# Patient Record
Sex: Female | Born: 1990 | Hispanic: Yes | Marital: Single | Smoking: Former smoker
Health system: Southern US, Community
[De-identification: ages and names within clinical notes are randomized; demographics above are authoritative.]

## PROBLEM LIST (undated history)

## (undated) ENCOUNTER — Inpatient Hospital Stay (HOSPITAL_COMMUNITY): Payer: Self-pay

## (undated) DIAGNOSIS — F32A Depression, unspecified: Secondary | ICD-10-CM

## (undated) DIAGNOSIS — F329 Major depressive disorder, single episode, unspecified: Secondary | ICD-10-CM

## (undated) HISTORY — PX: TUBAL LIGATION: SHX77

---

## 2008-06-28 ENCOUNTER — Emergency Department (HOSPITAL_COMMUNITY): Admission: EM | Admit: 2008-06-28 | Discharge: 2008-06-28 | Payer: Self-pay | Admitting: Emergency Medicine

## 2008-06-29 ENCOUNTER — Inpatient Hospital Stay (HOSPITAL_COMMUNITY): Admission: AD | Admit: 2008-06-29 | Discharge: 2008-07-05 | Payer: Self-pay | Admitting: Obstetrics

## 2008-07-06 ENCOUNTER — Inpatient Hospital Stay (HOSPITAL_COMMUNITY): Admission: AD | Admit: 2008-07-06 | Discharge: 2008-07-06 | Payer: Self-pay | Admitting: Obstetrics

## 2008-08-05 ENCOUNTER — Inpatient Hospital Stay (HOSPITAL_COMMUNITY): Admission: AD | Admit: 2008-08-05 | Discharge: 2008-08-05 | Payer: Self-pay | Admitting: Obstetrics

## 2008-08-06 ENCOUNTER — Inpatient Hospital Stay (HOSPITAL_COMMUNITY): Admission: AD | Admit: 2008-08-06 | Discharge: 2008-08-06 | Payer: Self-pay | Admitting: Obstetrics

## 2008-08-09 ENCOUNTER — Inpatient Hospital Stay (HOSPITAL_COMMUNITY): Admission: AD | Admit: 2008-08-09 | Discharge: 2008-08-24 | Payer: Self-pay | Admitting: Obstetrics

## 2008-08-09 ENCOUNTER — Encounter: Payer: Self-pay | Admitting: Obstetrics

## 2008-08-21 ENCOUNTER — Encounter (INDEPENDENT_AMBULATORY_CARE_PROVIDER_SITE_OTHER): Payer: Self-pay | Admitting: Obstetrics

## 2008-08-26 ENCOUNTER — Inpatient Hospital Stay (HOSPITAL_COMMUNITY): Admission: AD | Admit: 2008-08-26 | Discharge: 2008-08-26 | Payer: Self-pay | Admitting: Obstetrics

## 2008-09-03 ENCOUNTER — Inpatient Hospital Stay (HOSPITAL_COMMUNITY): Admission: AD | Admit: 2008-09-03 | Discharge: 2008-09-03 | Payer: Self-pay | Admitting: Obstetrics

## 2010-08-22 ENCOUNTER — Other Ambulatory Visit: Payer: Self-pay | Admitting: Obstetrics & Gynecology

## 2010-08-22 ENCOUNTER — Inpatient Hospital Stay (HOSPITAL_COMMUNITY): Payer: Self-pay

## 2010-08-22 ENCOUNTER — Inpatient Hospital Stay (HOSPITAL_COMMUNITY)
Admission: AD | Admit: 2010-08-22 | Discharge: 2010-08-22 | Disposition: A | Discharge status: Home / Self Care | Payer: Self-pay | Source: Ambulatory Visit | Attending: Obstetrics & Gynecology | Admitting: Obstetrics & Gynecology

## 2010-08-22 ENCOUNTER — Inpatient Hospital Stay (HOSPITAL_COMMUNITY)
Admission: AD | Admit: 2010-08-22 | Discharge: 2010-08-22 | Disposition: A | Discharge status: Home / Self Care | Payer: Self-pay | Source: Ambulatory Visit | Attending: Obstetrics and Gynecology | Admitting: Obstetrics and Gynecology

## 2010-08-22 DIAGNOSIS — O200 Threatened abortion: Secondary | ICD-10-CM | POA: Insufficient documentation

## 2010-08-22 LAB — ABO/RH: ABO/RH(D): O POS

## 2010-08-22 LAB — WET PREP, GENITAL
Trich, Wet Prep: NONE SEEN
Yeast Wet Prep HPF POC: NONE SEEN

## 2010-08-22 LAB — URINE MICROSCOPIC-ADD ON

## 2010-08-22 LAB — URINALYSIS, ROUTINE W REFLEX MICROSCOPIC
Bilirubin Urine: NEGATIVE
Glucose, UA: NEGATIVE mg/dL
Ketones, ur: >80 mg/dL — AB
pH: 6 (ref 5.0–8.0)

## 2010-08-22 LAB — CBC
Hemoglobin: 12 g/dL (ref 12.0–15.0)
RBC: 3.76 MIL/uL — ABNORMAL LOW (ref 3.87–5.11)

## 2010-08-24 ENCOUNTER — Inpatient Hospital Stay (HOSPITAL_COMMUNITY)
Admission: AD | Admit: 2010-08-24 | Discharge: 2010-08-24 | Disposition: A | Discharge status: Home / Self Care | Payer: Self-pay | Source: Ambulatory Visit | Attending: Obstetrics & Gynecology | Admitting: Obstetrics & Gynecology

## 2010-08-24 DIAGNOSIS — O035 Genital tract and pelvic infection following complete or unspecified spontaneous abortion: Secondary | ICD-10-CM

## 2010-08-24 DIAGNOSIS — O039 Complete or unspecified spontaneous abortion without complication: Secondary | ICD-10-CM | POA: Insufficient documentation

## 2010-08-24 LAB — HCG, QUANTITATIVE, PREGNANCY: hCG, Beta Chain, Quant, S: 25 m[IU]/mL — ABNORMAL HIGH (ref <5)

## 2010-08-30 LAB — CBC
HCT: 29.4 % — ABNORMAL LOW (ref 36.0–46.0)
HCT: 30.5 % — ABNORMAL LOW (ref 36.0–46.0)
Hemoglobin: 10.2 g/dL — ABNORMAL LOW (ref 12.0–15.0)
Hemoglobin: 10.6 g/dL — ABNORMAL LOW (ref 12.0–15.0)
Hemoglobin: 8.8 g/dL — ABNORMAL LOW (ref 12.0–15.0)
MCHC: 34.6 g/dL (ref 30.0–36.0)
MCHC: 34.6 g/dL (ref 30.0–36.0)
MCHC: 35.3 g/dL (ref 30.0–36.0)
MCV: 97 fL (ref 78.0–100.0)
MCV: 97.9 fL (ref 78.0–100.0)
RBC: 3.01 MIL/uL — ABNORMAL LOW (ref 3.87–5.11)
RBC: 3.12 MIL/uL — ABNORMAL LOW (ref 3.87–5.11)
RDW: 13.4 % (ref 11.5–15.5)
RDW: 13.6 % (ref 11.5–15.5)
RDW: 13.7 % (ref 11.5–15.5)

## 2010-08-30 LAB — URINALYSIS, ROUTINE W REFLEX MICROSCOPIC
Ketones, ur: 15 mg/dL — AB
Protein, ur: NEGATIVE mg/dL
Urobilinogen, UA: 0.2 mg/dL (ref 0.0–1.0)

## 2010-08-30 LAB — URINE MICROSCOPIC-ADD ON

## 2010-08-31 ENCOUNTER — Inpatient Hospital Stay (HOSPITAL_COMMUNITY)
Admission: AD | Admit: 2010-08-31 | Discharge: 2010-08-31 | Disposition: A | Discharge status: Home / Self Care | Payer: Self-pay | Source: Ambulatory Visit | Attending: Obstetrics & Gynecology | Admitting: Obstetrics & Gynecology

## 2010-08-31 DIAGNOSIS — O039 Complete or unspecified spontaneous abortion without complication: Secondary | ICD-10-CM | POA: Insufficient documentation

## 2010-08-31 LAB — GLUCOSE TOLERANCE, 1 HOUR: Glucose, 1 Hour GTT: 92 mg/dL (ref 70–140)

## 2010-08-31 LAB — CBC
MCHC: 34.3 g/dL (ref 30.0–36.0)
MCV: 99.2 fL (ref 78.0–100.0)
Platelets: 223 10*3/uL (ref 150–400)
RBC: 3.37 MIL/uL — ABNORMAL LOW (ref 3.87–5.11)
RDW: 13.9 % (ref 11.5–15.5)

## 2010-08-31 LAB — RAPID HIV SCREEN (WH-MAU): Rapid HIV Screen: NONREACTIVE

## 2010-09-05 LAB — CBC
HCT: 33 % — ABNORMAL LOW (ref 36.0–46.0)
HCT: 33.9 % — ABNORMAL LOW (ref 36.0–46.0)
Hemoglobin: 11.3 g/dL — ABNORMAL LOW (ref 12.0–15.0)
Hemoglobin: 12.1 g/dL (ref 12.0–15.0)
Hemoglobin: 11.1 g/dL — ABNORMAL LOW (ref 12.0–15.0)
MCHC: 35.3 g/dL (ref 30.0–36.0)
MCV: 95.8 fL (ref 78.0–100.0)
Platelets: 216 10*3/uL (ref 150–400)
RBC: 3.29 MIL/uL — ABNORMAL LOW (ref 3.87–5.11)
RBC: 3.54 MIL/uL — ABNORMAL LOW (ref 3.87–5.11)
RBC: 3.34 MIL/uL — ABNORMAL LOW (ref 3.87–5.11)
RDW: 14 % (ref 11.5–15.5)
RDW: 14.2 % (ref 11.5–15.5)
WBC: 12.5 10*3/uL — ABNORMAL HIGH (ref 4.0–10.5)
WBC: 13.4 10*3/uL — ABNORMAL HIGH (ref 4.0–10.5)
WBC: 8.4 10*3/uL (ref 4.0–10.5)

## 2010-09-05 LAB — BASIC METABOLIC PANEL
BUN: 4 mg/dL — ABNORMAL LOW (ref 6–23)
Chloride: 102 mEq/L (ref 96–112)
GFR calc Af Amer: >60 mL/min (ref >60)
GFR calc non Af Amer: >60 mL/min (ref >60)
Potassium: 3.5 mEq/L (ref 3.5–5.1)
Sodium: 133 mEq/L — ABNORMAL LOW (ref 135–145)

## 2010-09-05 LAB — DIFFERENTIAL
Lymphs Abs: 1.7 10*3/uL (ref 0.7–4.0)
Monocytes Absolute: 0.5 10*3/uL (ref 0.1–1.0)
Monocytes Relative: 6 % (ref 3–12)
Neutro Abs: 6 10*3/uL (ref 1.7–7.7)
Neutrophils Relative %: 72 % (ref 43–77)

## 2010-09-05 LAB — WET PREP, GENITAL
Clue Cells Wet Prep HPF POC: NONE SEEN
Clue Cells Wet Prep HPF POC: NONE SEEN
Trich, Wet Prep: NONE SEEN

## 2010-09-13 ENCOUNTER — Encounter: Payer: Self-pay | Admitting: Obstetrics and Gynecology

## 2010-10-03 NOTE — Op Note (Signed)
Anita [name], THURGOOD              ACCOUNT NO.:  [id number]   MEDICAL RECORD NO.:  [id number]          PATIENT TYPE:  INP   LOCATION:  9149                          [facility]ILITY:  [facility]   PHYSICIAN:  Kathreen Cosier, M.D.DATE OF BIRTH:  15-Dec-1990   DATE OF PROCEDURE:  08/22/2008  DATE OF DISCHARGE:                               OPERATIVE REPORT   PREOPERATIVE DIAGNOSES:  Prolonged ruptured membranes since August 09, 2008, breech presentation, and now active labor and bleeding.   PROCEDURE:  Classical cesarean section.   SURGEON:  Kathreen Cosier, MD   ANESTHESIA:  Spinal.   PROCEDURE:  The patient was placed on the operating table in supine  position.  After spinal administered, abdomen prepped and draped.  Bladder emptied with a Foley catheter.  Transverse suprapubic incision  was made, carried down to the rectus fascia.  Fascia cleaned and incised  length of the incision.  Recti muscles retracted laterally.  Peritoneum  was incised longitudinally, the lower uterine segment was not developed  and it was decided do a classical incision on the uterus.  This was done  and the patient was delivered of a frank breech female, Apgars 5 and 7,  weighing 1190 g.  The team was in attendance.  The placenta was  anterior, low lying, removed manually, and the uterine cavity cleaned  with dry laps.  The uterine incision was closed in 2 layers with  continuous suture of #1 chromic.  Hemostasis was satisfactory.  Blood  loss was 500 mL.  The lap and sponge counts were correct.  The abdomen  was closed in layers.  The peritoneum was closed with continuous suture  of 0 chromic.  The fascia was closed with 0 Dexon continuous.  Skin  closed with subcuticular stitch of 4-0 Monocryl.  The patient tolerated  the procedure well, taken to recovery room in good condition.            ______________________________  Kathreen Cosier, M.D.     BAM/MEDQ  D:  08/22/2008  T:  08/22/2008  Job:   [id number]

## 2010-10-03 NOTE — Discharge Summary (Signed)
NAMECATARINA, Anita [name]              ACCOUNT NO.:  [id number]   MEDICAL RECORD NO.:  [id number]          PATIENT TYPE:  INP   LOCATION:  9319                          [facility]   PHYSICIAN:  Kathreen Cosier, M.D.DATE OF BIRTH:  [birth year]   DATE OF ADMISSION:  08/09/2008  DATE OF DISCHARGE:                               DISCHARGE SUMMARY   The patient is a 20 year old gravida 1, EDC November 25, 2008.  She had  premature ruptured membranes at 19 weeks and had been on bed rest at  home.  No fever, no contractions with minimal leakage, and she was  readmitted at 24 weeks and ultrasound showed a breech presentation, AFI  1 cm.  She got betamethasone x2 and the estimated weight by ultrasound  at that point was greater than 800 g.  The patient got Delta-Lutin 250  mg IM weekly and she was also on Procardia.  On August 21, 2008,  ultrasound showed still a breech, 2 pounds 6 ounces, and the patient  started contracting and having some bleeding and underwent classical C-  section on August 22, 2008, to a low-lying anterior placenta.  Apgars were  5 and 7, baby weighed 1190 g.  Placenta was sent to Pathology.  Postoperatively, she did well.  Her hemoglobin was 8.8.  She was  discharged on the third postoperative day, ambulatory on a regular diet  to see me in 6 weeks.   DISCHARGE DIAGNOSES:  Status post prolonged ruptured membranes from 19  weeks, classical C-section, to see me in 6 weeks.           ______________________________  Kathreen Cosier, M.D.     BAM/MEDQ  D:  08/24/2008  T:  08/24/2008  Job:  [id number]

## 2010-10-03 NOTE — Discharge Summary (Signed)
[name], [name]              ACCOUNT NO.:  [id number]   MEDICAL RECORD NO.:  [id number]          PATIENT TYPE:  INP   LOCATION:  9151                          [facility]   PHYSICIAN:  Kathreen Cosier, M.D.DATE OF BIRTH:  December 16, 1990   DATE OF ADMISSION:  06/29/2008  DATE OF DISCHARGE:  07/05/2008                               DISCHARGE SUMMARY   The patient is a 20 year old gravida 1, EDC November 25, 2008, and she had  gone to [facility] on February 8 with left lower quadrant pain and  sent home on bed rest, and she was seen on February 9.  Speculum exam by  the PA was Nitrazine positive.  Sonogram showed normal flow.  Cervix was  long, closed, and she was admitted on bed rest and no leakage was noted.  Repeat speculum exam on February 10, Nitrazine negative.  Cervix long,  closed.  She was on ampicillin 2 g IV every 6 hours and erythromycin 500  IV and 250 every 6 hours.  She had had some irritability and was placed  on magnesium.  On July 01, 2008, she saturated a pad and had  occasional contractions from the night of speculum exam.  On the morning  of February 11, showed fluid coming from the cervix, Nitrazine positive  when she was 19 weeks and 4 days, and the patient elected not to  terminate the pregnancy.  Her leakage was very intermittent and she went  48 hours without leakage.  She was taught how to use a thermometer, she  is on bed rest at home.  She was also instructed to come to the  hospital, if her temperature is 100.4 or greater than that.  She was  seen in consult with MFM who concurred a decision to send her home on  bedrest since she had stopped leaking.  She was also placed on Procardia  XL 30 b.i.d.   DISCHARGE DIAGNOSIS:  Status post premature ruptured membranes at 19  weeks and 4 days.           ______________________________  Kathreen Cosier, M.D.     BAM/MEDQ  D:  07/28/2008  T:  07/28/2008  Job:  [id number]

## 2010-11-23 ENCOUNTER — Other Ambulatory Visit: Payer: Self-pay | Admitting: Obstetrics and Gynecology

## 2010-11-23 ENCOUNTER — Encounter (INDEPENDENT_AMBULATORY_CARE_PROVIDER_SITE_OTHER): Payer: Self-pay | Admitting: Obstetrics and Gynecology

## 2010-11-23 DIAGNOSIS — Z124 Encounter for screening for malignant neoplasm of cervix: Secondary | ICD-10-CM

## 2010-11-23 DIAGNOSIS — Z01419 Encounter for gynecological examination (general) (routine) without abnormal findings: Secondary | ICD-10-CM

## 2010-11-24 NOTE — Group Therapy Note (Signed)
NAME:  Anita [name], Anita [name]              ACCOUNT NO.:  [id number]  MEDICAL RECORD NO.:  [id number]           PATIENT TYPE:  A  LOCATION:  [facility]                   FACILITY:  [facility]  PHYSICIAN:  Argentina Donovan, MD        DATE OF BIRTH:  May 19, 1991  DATE OF SERVICE:  11/23/2010                                 CLINIC NOTE  The patient is a 20 year old English-speaking [place] female, who had a cesarean section at 24 weeks with a pregnancy 2 years ago after rupturing membranes at 19 weeks and being in the hospital for several weeks.  She said that she had uterine infection and that is why they did the C-section.  The baby did not survive.  Then, this past April, she was a couple of weeks pregnant and her beta-hCG just about a 100, went into the MAU.  She was bleeding and 2 days later, was down to two.  She had one period, is waiting to have another.  They are trying to conceive.  She is currently on prenatal vitamins.  She has not had a Pap smear in a year and her only complaint is that she has full urge to urinate following sex.  Her urinalysis and pelvic wet prep was negative in the MAU, so I am giving her a prescription of Macrobid to take one before or after sex each time to see if that does not get rid of that condition for her.  On examination, external genitalia is normal.  BUS within normal limits.  Vagina is clean and well rugated.  The cervix is clean, little areas of inflammation.  Pap smear was taken.  Uterus was anterior, normal in size, shape, consistency and the adnexa is normal.  IMPRESSION:  Normal gynecological examination, pending Pap smear result, history of preterm labor, gravida 2, para 0-1-1-0.          ______________________________ Argentina Donovan, MD    PR/MEDQ  D:  11/23/2010  T:  11/24/2010  Job:  [id number]

## 2011-07-02 ENCOUNTER — Other Ambulatory Visit: Payer: Self-pay | Admitting: Obstetrics

## 2011-07-02 NOTE — H&P (Signed)
Anita [name], Anita [name]              ACCOUNT NO.:  [id number]  MEDICAL RECORD NO.:  [id number]  LOCATION:  PERIO                         [facility]  PHYSICIAN:  Kathreen Cosier, M.D.DATE OF BIRTH:  03-21-1991  DATE OF ADMISSION:  07/03/2011 DATE OF DISCHARGE:                             HISTORY & PHYSICAL   HISTORY OF PRESENT ILLNESS:  The patient is a 21 year old, gravida 3, para 0-1-1-0 who has had 26 week loss.  She had a classical C-section. 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 By ultrasound, she is due December 31, 2011, making her 13 weeks and 4 days, and  she is now in for cervical cerclage.  PAST MEDICAL HISTORY:  The patient had a history of postpartum depression.  PAST SURGICAL HISTORY:  Classical C-section.  SOCIAL HISTORY:  Negative.  FAMILY HISTORY:  Negative.  SYSTEM REVIEW:  Negative.  PHYSICAL EXAMINATION:  GENERAL:  Well-developed female in no distress. HEENT:  Negative. LUNGS:  Clear. HEART:  Regular rhythm.  No murmurs, no gallops. BREASTS:  No masses. ABDOMEN:  Negative.  Uterus 14 weeks size. PELVIC:  As described above. EXTREMITIES:  Negative.          ______________________________ Kathreen Cosier, M.D.     BAM/MEDQ  D:  07/02/2011  T:  07/02/2011  Job:  [id number]

## 2011-07-03 ENCOUNTER — Encounter (HOSPITAL_COMMUNITY): Payer: Self-pay | Admitting: Pharmacy Technician

## 2011-07-03 ENCOUNTER — Ambulatory Visit (HOSPITAL_COMMUNITY)
Admission: RE | Admit: 2011-07-03 | Discharge: 2011-07-03 | Disposition: A | Discharge status: Home / Self Care | Payer: Self-pay | Source: Ambulatory Visit | Attending: Obstetrics | Admitting: Obstetrics

## 2011-07-03 ENCOUNTER — Encounter (HOSPITAL_COMMUNITY)
Admission: RE | Disposition: A | Discharge status: Home / Self Care | Payer: Self-pay | Source: Ambulatory Visit | Attending: Obstetrics

## 2011-07-03 ENCOUNTER — Ambulatory Visit (HOSPITAL_COMMUNITY): Payer: Self-pay | Admitting: Anesthesiology

## 2011-07-03 ENCOUNTER — Encounter (HOSPITAL_COMMUNITY): Payer: Self-pay | Admitting: *Deleted

## 2011-07-03 ENCOUNTER — Encounter (HOSPITAL_COMMUNITY): Payer: Self-pay | Admitting: Anesthesiology

## 2011-07-03 DIAGNOSIS — O3430 Maternal care for cervical incompetence, unspecified trimester: Secondary | ICD-10-CM | POA: Insufficient documentation

## 2011-07-03 DIAGNOSIS — Z98891 History of uterine scar from previous surgery: Secondary | ICD-10-CM

## 2011-07-03 HISTORY — PX: CERVICAL CERCLAGE: SHX1329

## 2011-07-03 HISTORY — DX: Major depressive disorder, single episode, unspecified: F32.9

## 2011-07-03 HISTORY — DX: Depression, unspecified: F32.A

## 2011-07-03 LAB — CBC
HCT: 34.5 % — ABNORMAL LOW (ref 36.0–46.0)
Hemoglobin: 12 g/dL (ref 12.0–15.0)
MCH: 32.3 pg (ref 26.0–34.0)
MCV: 92.7 fL (ref 78.0–100.0)
RBC: 3.72 MIL/uL — ABNORMAL LOW (ref 3.87–5.11)

## 2011-07-03 SURGERY — CERCLAGE, CERVIX, VAGINAL APPROACH
Anesthesia: Spinal | Site: Cervix | Wound class: Clean Contaminated

## 2011-07-03 MED ORDER — FENTANYL CITRATE 0.05 MG/ML IJ SOLN: 25.0000 ug | INTRAMUSCULAR | Status: DC | PRN

## 2011-07-03 MED ORDER — FENTANYL CITRATE 0.05 MG/ML IJ SOLN
INTRAMUSCULAR | Status: AC
  Filled 2011-07-03: qty 2

## 2011-07-03 MED ORDER — FENTANYL CITRATE 0.05 MG/ML IJ SOLN
INTRAMUSCULAR | Status: DC | PRN
  Administered 2011-07-03: 50 ug via INTRAVENOUS

## 2011-07-03 MED ORDER — EPHEDRINE 5 MG/ML INJ
INTRAVENOUS | Status: AC
  Filled 2011-07-03: qty 10

## 2011-07-03 MED ORDER — LACTATED RINGERS IV SOLN
INTRAVENOUS | Status: DC
  Administered 2011-07-03 (×2): via INTRAVENOUS

## 2011-07-03 MED ORDER — BUPIVACAINE IN DEXTROSE 0.75-8.25 % IT SOLN
INTRATHECAL | Status: DC | PRN
  Administered 2011-07-03: 1 mL via INTRATHECAL

## 2011-07-03 MED ORDER — ONDANSETRON HCL 4 MG/2ML IJ SOLN
INTRAMUSCULAR | Status: AC
  Filled 2011-07-03: qty 2

## 2011-07-03 MED ORDER — EPHEDRINE SULFATE 50 MG/ML IJ SOLN
INTRAMUSCULAR | Status: DC | PRN
  Administered 2011-07-03 (×3): 10 mg via INTRAVENOUS

## 2011-07-03 MED ORDER — ONDANSETRON HCL 4 MG/2ML IJ SOLN
INTRAMUSCULAR | Status: DC | PRN
  Administered 2011-07-03: 4 mg via INTRAVENOUS

## 2011-07-03 SURGICAL SUPPLY — 15 items
CATH ROBINSON RED A/P 16FR (CATHETERS) ×2 IMPLANT
CLOTH BEACON ORANGE TIMEOUT ST (SAFETY) ×2 IMPLANT
COUNTER NEEDLE 1200 MAGNETIC (NEEDLE) ×2 IMPLANT
GLOVE BIO SURGEON STRL SZ8.5 (GLOVE) ×4 IMPLANT
GOWN PREVENTION PLUS XXLARGE (GOWN DISPOSABLE) ×2 IMPLANT
GOWN STRL REIN XL XLG (GOWN DISPOSABLE) ×4 IMPLANT
NEEDLE MA TROC 1/2 (NEEDLE) IMPLANT
NEEDLE MAYO .5 CIRCLE (NEEDLE) ×2 IMPLANT
PACK VAGINAL MINOR WOMEN LF (CUSTOM PROCEDURE TRAY) ×2 IMPLANT
PAD PREP 24X48 CUFFED NSTRL (MISCELLANEOUS) ×2 IMPLANT
SUT MERSILENE 5MM BP 1 12 (SUTURE) ×2 IMPLANT
TOWEL OR 17X24 6PK STRL BLUE (TOWEL DISPOSABLE) ×4 IMPLANT
TUBING NON-CON 1/4 X 20 CONN (TUBING) IMPLANT
WATER STERILE IRR 1000ML POUR (IV SOLUTION) ×2 IMPLANT
YANKAUER SUCT BULB TIP NO VENT (SUCTIONS) ×2 IMPLANT

## 2011-07-03 NOTE — H&P (Signed)
  There has been no change in the patient's history and her physical exam today is unchanged and

## 2011-07-03 NOTE — Transfer of Care (Signed)
Immediate Anesthesia Transfer of Care Note  Patient: Anita [name]  Procedure(s) Performed: Procedure(s) (LRB): CERCLAGE CERVICAL (N/A)  Patient Location: PACU  Anesthesia Type: Spinal  Level of Consciousness: awake, alert  and oriented  Airway & Oxygen Therapy: Patient Spontanous Breathing  Post-op Assessment: Report given to PACU RN and Post -op Vital signs reviewed and stable  Post vital signs: Reviewed and stable  Complications: No apparent anesthesia complications

## 2011-07-03 NOTE — Op Note (Signed)
preop diagnosis incompetent cervix Postop diagnosis the same Procedure cervical cerclage Anesthesia spinal Surgeon Dr. Francoise Ceo Procedure after the spinal administered patient in the lithotomy position perineum and vagina prepped and draped data entered with a straight catheter weighted speculum placed the vagina and the cervix was grasped at 12:00 with a sponge forcep using a #5 Mersilene band cervix was entered at 12:00 exited at 9:00 and then a you because of Mersilene brought up to 6:00 this is in and out of side in the usual manner and the is Mersilene tied at 6:00 patient tolerated the procedure well taken to recovery room in good condition end of dictation dictated by Dr. Gaynell Face to follow 13 thank you

## 2011-07-03 NOTE — Discharge Instructions (Signed)
Discharge instructions   You can wash your hair  Shower  Eat what you want  Drink what you want  See me in 6 weeks  Your ankles are going to swell more in the next 2 weeks than when pregnant  No sex for 6 weeks   Kiylah Loyer A, MD 07/03/2011

## 2011-07-03 NOTE — Anesthesia Procedure Notes (Signed)
Spinal  Patient location during procedure: OR Start time: 07/03/2011 7:12 AM End time: 07/03/2011 7:14 AM Staffing Anesthesiologist: Sandrea Hughs Performed by: anesthesiologist  Preanesthetic Checklist Completed: patient identified, site marked, surgical consent, pre-op evaluation, timeout performed, IV checked, risks and benefits discussed and monitors and equipment checked Spinal Block Patient position: sitting Prep: DuraPrep Patient monitoring: heart rate, cardiac monitor, continuous pulse ox and blood pressure Approach: midline Location: L3-4 Injection technique: single-shot Needle Needle type: Sprotte  Needle gauge: 24 G Needle length: 9 cm Needle insertion depth: 4 cm Assessment Sensory level: T12

## 2011-07-03 NOTE — Anesthesia Preprocedure Evaluation (Signed)
Anesthesia Evaluation  Patient identified by MRN, date of birth, ID band Patient awake    Reviewed: Allergy & Precautions, H&P , NPO status , Patient's Chart, lab work & pertinent test results, reviewed documented beta blocker date and time   History of Anesthesia Complications Negative for: history of anesthetic complications  Airway Mallampati: I TM Distance: >3 FB Neck ROM: full    Dental  (+) Teeth Intact   Pulmonary neg pulmonary ROS,  clear to auscultation        Cardiovascular neg cardio ROS regular Normal    Neuro/Psych PSYCHIATRIC DISORDERS (no meds) Negative Neurological ROS     GI/Hepatic negative GI ROS, Neg liver ROS,   Endo/Other  Negative Endocrine ROS  Renal/GU negative Renal ROS  Genitourinary negative   Musculoskeletal   Abdominal   Peds  Hematology negative hematology ROS (+)   Anesthesia Other Findings   Reproductive/Obstetrics (+) Pregnancy (incompetent cervix)                           Anesthesia Physical Anesthesia Plan  ASA: II  Anesthesia Plan: Spinal   Post-op Pain Management:    Induction:   Airway Management Planned:   Additional Equipment:   Intra-op Plan:   Post-operative Plan:   Informed Consent: I have reviewed the patients History and Physical, chart, labs and discussed the procedure including the risks, benefits and alternatives for the proposed anesthesia with the patient or authorized representative who has indicated his/her understanding and acceptance.   Dental Advisory Given  Plan Discussed with: Surgeon and CRNA  Anesthesia Plan Comments:         Anesthesia Quick Evaluation

## 2011-07-03 NOTE — Anesthesia Postprocedure Evaluation (Signed)
Anesthesia Post Note  Patient: Anita [name]  Procedure(s) Performed: Procedure(s) (LRB): CERCLAGE CERVICAL (N/A)  Anesthesia type: Spinal  Patient location: PACU  Post pain: Pain level controlled  Post assessment: Post-op Vital signs reviewed  Last Vitals:  Filed Vitals:   07/03/11 0610  BP: 96/53  Pulse:   Temp:   Resp:     Post vital signs: Reviewed  Level of consciousness: awake  Complications: No apparent anesthesia complications

## 2011-07-04 ENCOUNTER — Encounter (HOSPITAL_COMMUNITY): Payer: Self-pay | Admitting: Obstetrics

## 2011-08-15 LAB — OB RESULTS CONSOLE ABO/RH: RH Type: POSITIVE

## 2011-08-15 LAB — OB RESULTS CONSOLE HEPATITIS B SURFACE ANTIGEN: Hepatitis B Surface Ag: NEGATIVE

## 2011-08-15 LAB — OB RESULTS CONSOLE GC/CHLAMYDIA: Chlamydia: NEGATIVE

## 2011-11-20 ENCOUNTER — Other Ambulatory Visit: Payer: Self-pay | Admitting: Obstetrics

## 2011-11-21 NOTE — H&P (Signed)
NAME:  Anita [name], Anita [name]      ACCOUNT NO.:  [id number]  MEDICAL RECORD NO.:  [id number]  LOCATION:                                 FACILITY:  PHYSICIAN:  Kathreen Cosier, M.D.   DATE OF BIRTH:  DATE OF ADMISSION: DATE OF DISCHARGE:                             HISTORY & PHYSICAL   The patient is a 21 year old, gravida 3, para 0-1-1-0 whose EDC is December 31, 2011.  She is [redacted] weeks pregnant.  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  She had a classical incision done on her uterus, and at the recommendation of maternal fetal medicine, she is now in for a repeat C-section at 36 weeks.  The patient also had a history of incompetent cervix, and she had a cerclage placed on July 03, 2011.  She has been kept on progesterone suppositories throughout the pregnancy and Procardia 60 XL and has done well up to this point, so she is in now at 36 weeks for repeat C-section.  PAST MEDICAL HISTORY:  As described above.  PAST SURGICAL HISTORY:  As described above.  SYSTEM REVIEW:  Negative.  PHYSICAL EXAM:  GENERAL: Revealed a well-developed female, in no distress. HEENT:  Negative. BREASTS: Negative. LUNGS: Clear. HEART: Regular rhythm.  No murmurs, no gallops. ABDOMEN: Term. CERVIX:  Cerclage. EXTREMITIES: Negative.          ______________________________ Kathreen Cosier, M.D.     BAM/MEDQ  D:  11/20/2011  T:  11/21/2011  Job:  [id number]

## 2011-11-24 ENCOUNTER — Encounter (HOSPITAL_COMMUNITY): Payer: Self-pay | Admitting: Pharmacist

## 2011-11-27 ENCOUNTER — Encounter (HOSPITAL_COMMUNITY): Payer: Self-pay

## 2011-11-28 ENCOUNTER — Encounter (HOSPITAL_COMMUNITY)
Admission: RE | Admit: 2011-11-28 | Discharge: 2011-11-28 | Disposition: A | Discharge status: Home / Self Care | Payer: Medicaid Other | Source: Ambulatory Visit | Attending: Obstetrics | Admitting: Obstetrics

## 2011-11-28 ENCOUNTER — Encounter (HOSPITAL_COMMUNITY): Payer: Self-pay

## 2011-11-28 LAB — TYPE AND SCREEN

## 2011-11-28 LAB — SURGICAL PCR SCREEN: MRSA, PCR: NEGATIVE

## 2011-11-28 LAB — CBC
HCT: 34.6 % — ABNORMAL LOW (ref 36.0–46.0)
Hemoglobin: 11.8 g/dL — ABNORMAL LOW (ref 12.0–15.0)
MCH: 31.9 pg (ref 26.0–34.0)
MCHC: 34.1 g/dL (ref 30.0–36.0)

## 2011-11-28 LAB — RAPID HIV SCREEN (WH-MAU): Rapid HIV Screen: NONREACTIVE

## 2011-11-28 NOTE — Patient Instructions (Addendum)
  Your procedure is scheduled on: Tuesday, July 16  Enter through the [employer] of [facility] at: 10:00am Pick up the phone at the desk and dial [phone] and inform us of your arrival.  Please call this number if you have any problems the morning of surgery: [phone]  Remember: Do not eat food after midnight: Monday Do not drink clear liquids after: Monday  Do not wear jewelry, make-up, or FINGER nail polish No metal in your hair or on your body. Do not wear lotions, powders, perfumes or deodorant. Do not shave 48 hours prior to surgery. Do not bring valuables to the hospital. Contacts, dentures or bridgework may not be worn into surgery.  Leave suitcase in the car. After Surgery it may be brought to your room. For patients being admitted to the hospital, checkout time is 11:00am the day of discharge.  Remember to use your hibiclens as instructed.Please shower with 1/2 bottle the evening before your surgery and the other 1/2 bottle the morning of surgery. Neck down avoiding private area.

## 2011-12-03 NOTE — H&P (Signed)
NAME:  Anita [name], Anita [name]      ACCOUNT NO.:  [id number]  MEDICAL RECORD NO.:  [id number]  LOCATION:                                 FACILITY:  PHYSICIAN:  Kathreen Cosier, M.D.DATE OF BIRTH:  1991/03/02  DATE OF ADMISSION: DATE OF DISCHARGE:                             HISTORY & PHYSICAL   The patient is a 21 year old, gravida 3, para 0-1-1-0 whose EDC is December 31, 2011.  She is [redacted] weeks pregnant.  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  She had a classical incision done on her uterus at that time, and the recommendation of MFM is that she should now repeat section at 36 weeks without amniocentesis.  She also had a history of an incompetent cervix and she had a cerclage placed on July 03, 2011.  She has been on progesterone suppositories throughout the pregnancy on Procardia 60 XL once daily and has done well up to this point.  She is now at 36 weeks for repeat C-section.  PAST MEDICAL HISTORY:  As described above.  PAST SURGICAL HISTORY:  As described above.  SYSTEM REVIEW:  Negative.  PHYSICAL EXAMINATION:  GENERAL:  A well-developed female, in no distress. HEENT:  Negative. BREASTS:  Negative. LUNGS:  Clear. HEART:  Regular rhythm.  No murmurs, no gallops. ABDOMEN:  36 weeks size. GU:  Cervix closed with a cerclage. EXTREMITIES:  Negative.          ______________________________ Kathreen Cosier, M.D.     BAM/MEDQ  D:  12/03/2011  T:  12/03/2011  Job:  [id number]

## 2011-12-04 ENCOUNTER — Inpatient Hospital Stay (HOSPITAL_COMMUNITY)
Admission: RE | Admit: 2011-12-04 | Discharge: 2011-12-07 | DRG: 765 | Disposition: A | Discharge status: Home / Self Care | Payer: Medicaid Other | Source: Ambulatory Visit | Attending: Obstetrics | Admitting: Obstetrics

## 2011-12-04 ENCOUNTER — Inpatient Hospital Stay (HOSPITAL_COMMUNITY): Payer: Medicaid Other

## 2011-12-04 ENCOUNTER — Encounter (HOSPITAL_COMMUNITY): Payer: Self-pay | Admitting: *Deleted

## 2011-12-04 ENCOUNTER — Encounter (HOSPITAL_COMMUNITY): Payer: Self-pay

## 2011-12-04 ENCOUNTER — Encounter (HOSPITAL_COMMUNITY): Payer: Self-pay | Admitting: Anesthesiology

## 2011-12-04 ENCOUNTER — Encounter (HOSPITAL_COMMUNITY)
Admission: RE | Disposition: A | Discharge status: Home / Self Care | Payer: Self-pay | Source: Ambulatory Visit | Attending: Obstetrics

## 2011-12-04 DIAGNOSIS — R51 Headache: Secondary | ICD-10-CM | POA: Diagnosis not present

## 2011-12-04 DIAGNOSIS — O99893 Other specified diseases and conditions complicating puerperium: Secondary | ICD-10-CM | POA: Diagnosis not present

## 2011-12-04 DIAGNOSIS — O34219 Maternal care for unspecified type scar from previous cesarean delivery: Principal | ICD-10-CM | POA: Diagnosis present

## 2011-12-04 DIAGNOSIS — Z98891 History of uterine scar from previous surgery: Secondary | ICD-10-CM

## 2011-12-04 DIAGNOSIS — O3430 Maternal care for cervical incompetence, unspecified trimester: Secondary | ICD-10-CM | POA: Diagnosis present

## 2011-12-04 DIAGNOSIS — H538 Other visual disturbances: Secondary | ICD-10-CM | POA: Diagnosis not present

## 2011-12-04 LAB — TYPE AND SCREEN: ABO/RH(D): O POS

## 2011-12-04 SURGERY — Surgical Case
Anesthesia: Spinal | Site: Abdomen | Wound class: Clean Contaminated

## 2011-12-04 MED ORDER — OXYTOCIN 10 UNIT/ML IJ SOLN
INTRAMUSCULAR | Status: AC
  Filled 2011-12-04: qty 4

## 2011-12-04 MED ORDER — SIMETHICONE 80 MG PO CHEW: 80.0000 mg | CHEWABLE_TABLET | ORAL | Status: DC | PRN

## 2011-12-04 MED ORDER — BUPIVACAINE IN DEXTROSE 0.75-8.25 % IT SOLN
INTRATHECAL | Status: DC | PRN
  Administered 2011-12-04: 1.5 mL via INTRATHECAL

## 2011-12-04 MED ORDER — DIPHENHYDRAMINE HCL 50 MG/ML IJ SOLN: 25.0000 mg | INTRAMUSCULAR | Status: DC | PRN

## 2011-12-04 MED ORDER — NALBUPHINE SYRINGE 5 MG/0.5 ML
INJECTION | INTRAMUSCULAR | Status: AC
  Administered 2011-12-04: 10 mg via SUBCUTANEOUS
  Filled 2011-12-04: qty 1

## 2011-12-04 MED ORDER — DIPHENHYDRAMINE HCL 25 MG PO CAPS: 25.0000 mg | ORAL_CAPSULE | Freq: Four times a day (QID) | ORAL | Status: DC | PRN

## 2011-12-04 MED ORDER — SODIUM CHLORIDE 0.9 % IJ SOLN: 3.0000 mL | INTRAMUSCULAR | Status: DC | PRN

## 2011-12-04 MED ORDER — CEFAZOLIN SODIUM-DEXTROSE 2-3 GM-% IV SOLR
2.0000 g | Freq: Once | INTRAVENOUS | Status: AC
  Administered 2011-12-04: 2 g via INTRAVENOUS

## 2011-12-04 MED ORDER — ONDANSETRON HCL 4 MG/2ML IJ SOLN: 4.0000 mg | Freq: Three times a day (TID) | INTRAMUSCULAR | Status: DC | PRN

## 2011-12-04 MED ORDER — LACTATED RINGERS IV SOLN
INTRAVENOUS | Status: DC
  Administered 2011-12-04: 21:00:00 via INTRAVENOUS

## 2011-12-04 MED ORDER — NALBUPHINE SYRINGE 5 MG/0.5 ML
5.0000 mg | INJECTION | INTRAMUSCULAR | Status: DC | PRN
  Administered 2011-12-04 – 2011-12-05 (×2): 10 mg via SUBCUTANEOUS
  Filled 2011-12-04 (×2): qty 1

## 2011-12-04 MED ORDER — SCOPOLAMINE 1 MG/3DAYS TD PT72: 1.0000 | MEDICATED_PATCH | Freq: Once | TRANSDERMAL | Status: DC

## 2011-12-04 MED ORDER — ZOLPIDEM TARTRATE 5 MG PO TABS: 5.0000 mg | ORAL_TABLET | Freq: Every evening | ORAL | Status: DC | PRN

## 2011-12-04 MED ORDER — FENTANYL CITRATE 0.05 MG/ML IJ SOLN
INTRAMUSCULAR | Status: AC
  Filled 2011-12-04: qty 2

## 2011-12-04 MED ORDER — OXYCODONE-ACETAMINOPHEN 5-325 MG PO TABS
1.0000 | ORAL_TABLET | ORAL | Status: DC | PRN
  Administered 2011-12-05 – 2011-12-07 (×6): 1 via ORAL
  Filled 2011-12-04 (×6): qty 1

## 2011-12-04 MED ORDER — OXYTOCIN 40 UNITS IN LACTATED RINGERS INFUSION - SIMPLE MED: 62.5000 mL/h | INTRAVENOUS | Status: AC

## 2011-12-04 MED ORDER — NALOXONE HCL 0.4 MG/ML IJ SOLN: 0.4000 mg | INTRAMUSCULAR | Status: DC | PRN

## 2011-12-04 MED ORDER — NALBUPHINE SYRINGE 5 MG/0.5 ML
5.0000 mg | INJECTION | INTRAMUSCULAR | Status: DC | PRN
  Administered 2011-12-05: 5 mg via INTRAVENOUS
  Filled 2011-12-04 (×2): qty 1

## 2011-12-04 MED ORDER — WITCH HAZEL-GLYCERIN EX PADS: 1.0000 "application " | MEDICATED_PAD | CUTANEOUS | Status: DC | PRN

## 2011-12-04 MED ORDER — DIBUCAINE 1 % RE OINT: 1.0000 "application " | TOPICAL_OINTMENT | RECTAL | Status: DC | PRN

## 2011-12-04 MED ORDER — LACTATED RINGERS IV SOLN
INTRAVENOUS | Status: DC | PRN
  Administered 2011-12-04: 12:00:00 via INTRAVENOUS

## 2011-12-04 MED ORDER — SCOPOLAMINE 1 MG/3DAYS TD PT72
MEDICATED_PATCH | TRANSDERMAL | Status: AC
  Administered 2011-12-04: 1.5 mg via TRANSDERMAL
  Filled 2011-12-04: qty 1

## 2011-12-04 MED ORDER — OXYTOCIN 10 UNIT/ML IJ SOLN
INTRAMUSCULAR | Status: DC | PRN
  Administered 2011-12-04: 40 [IU] via INTRAMUSCULAR

## 2011-12-04 MED ORDER — KETOROLAC TROMETHAMINE 30 MG/ML IJ SOLN: 30.0000 mg | Freq: Four times a day (QID) | INTRAMUSCULAR | Status: AC | PRN

## 2011-12-04 MED ORDER — ONDANSETRON HCL 4 MG/2ML IJ SOLN: 4.0000 mg | INTRAMUSCULAR | Status: DC | PRN

## 2011-12-04 MED ORDER — SODIUM CHLORIDE 0.9 % IV SOLN
1.0000 ug/kg/h | INTRAVENOUS | Status: DC | PRN
  Filled 2011-12-04: qty 2.5

## 2011-12-04 MED ORDER — MEPERIDINE HCL 25 MG/ML IJ SOLN: 6.2500 mg | INTRAMUSCULAR | Status: DC | PRN

## 2011-12-04 MED ORDER — IBUPROFEN 600 MG PO TABS
600.0000 mg | ORAL_TABLET | Freq: Four times a day (QID) | ORAL | Status: DC | PRN
  Administered 2011-12-06: 600 mg via ORAL
  Filled 2011-12-04 (×9): qty 1

## 2011-12-04 MED ORDER — DIPHENHYDRAMINE HCL 25 MG PO CAPS
25.0000 mg | ORAL_CAPSULE | ORAL | Status: DC | PRN
  Filled 2011-12-04: qty 1

## 2011-12-04 MED ORDER — MORPHINE SULFATE (PF) 0.5 MG/ML IJ SOLN
INTRAMUSCULAR | Status: DC | PRN
  Administered 2011-12-04: 100 ug via INTRATHECAL

## 2011-12-04 MED ORDER — KETOROLAC TROMETHAMINE 60 MG/2ML IM SOLN
60.0000 mg | Freq: Once | INTRAMUSCULAR | Status: AC | PRN
  Administered 2011-12-04: 60 mg via INTRAMUSCULAR

## 2011-12-04 MED ORDER — LANOLIN HYDROUS EX OINT: 1.0000 "application " | TOPICAL_OINTMENT | CUTANEOUS | Status: DC | PRN

## 2011-12-04 MED ORDER — TETANUS-DIPHTH-ACELL PERTUSSIS 5-2.5-18.5 LF-MCG/0.5 IM SUSP
0.5000 mL | Freq: Once | INTRAMUSCULAR | Status: AC
  Administered 2011-12-05: 0.5 mL via INTRAMUSCULAR
  Filled 2011-12-04: qty 0.5

## 2011-12-04 MED ORDER — ONDANSETRON HCL 4 MG PO TABS: 4.0000 mg | ORAL_TABLET | ORAL | Status: DC | PRN

## 2011-12-04 MED ORDER — SENNOSIDES-DOCUSATE SODIUM 8.6-50 MG PO TABS
2.0000 | ORAL_TABLET | Freq: Every day | ORAL | Status: DC
  Administered 2011-12-04 – 2011-12-06 (×3): 2 via ORAL

## 2011-12-04 MED ORDER — PRENATAL MULTIVITAMIN CH
1.0000 | ORAL_TABLET | Freq: Every day | ORAL | Status: DC
  Administered 2011-12-05 – 2011-12-07 (×2): 1 via ORAL
  Filled 2011-12-04 (×2): qty 1

## 2011-12-04 MED ORDER — SCOPOLAMINE 1 MG/3DAYS TD PT72
1.0000 | MEDICATED_PATCH | Freq: Once | TRANSDERMAL | Status: DC
  Administered 2011-12-04: 1.5 mg via TRANSDERMAL

## 2011-12-04 MED ORDER — MENTHOL 3 MG MT LOZG: 1.0000 | LOZENGE | OROMUCOSAL | Status: DC | PRN

## 2011-12-04 MED ORDER — 0.9 % SODIUM CHLORIDE (POUR BTL) OPTIME
TOPICAL | Status: DC | PRN
  Administered 2011-12-04: 1000 mL

## 2011-12-04 MED ORDER — IBUPROFEN 600 MG PO TABS
600.0000 mg | ORAL_TABLET | Freq: Four times a day (QID) | ORAL | Status: DC
  Administered 2011-12-05 – 2011-12-07 (×9): 600 mg via ORAL
  Filled 2011-12-04 (×3): qty 1

## 2011-12-04 MED ORDER — FENTANYL CITRATE 0.05 MG/ML IJ SOLN: 25.0000 ug | INTRAMUSCULAR | Status: DC | PRN

## 2011-12-04 MED ORDER — SIMETHICONE 80 MG PO CHEW
80.0000 mg | CHEWABLE_TABLET | Freq: Three times a day (TID) | ORAL | Status: DC
  Administered 2011-12-04 – 2011-12-07 (×8): 80 mg via ORAL

## 2011-12-04 MED ORDER — KETOROLAC TROMETHAMINE 60 MG/2ML IM SOLN
INTRAMUSCULAR | Status: AC
  Administered 2011-12-04: 60 mg via INTRAMUSCULAR
  Filled 2011-12-04: qty 2

## 2011-12-04 MED ORDER — FENTANYL CITRATE 0.05 MG/ML IJ SOLN
INTRAMUSCULAR | Status: DC | PRN
  Administered 2011-12-04: 15 ug via INTRATHECAL

## 2011-12-04 MED ORDER — CEFAZOLIN SODIUM-DEXTROSE 2-3 GM-% IV SOLR
INTRAVENOUS | Status: AC
  Filled 2011-12-04: qty 50

## 2011-12-04 MED ORDER — ONDANSETRON HCL 4 MG/2ML IJ SOLN
INTRAMUSCULAR | Status: DC | PRN
  Administered 2011-12-04: 4 mg via INTRAVENOUS

## 2011-12-04 MED ORDER — METOCLOPRAMIDE HCL 5 MG/ML IJ SOLN: 10.0000 mg | Freq: Three times a day (TID) | INTRAMUSCULAR | Status: DC | PRN

## 2011-12-04 MED ORDER — DIPHENHYDRAMINE HCL 50 MG/ML IJ SOLN
12.5000 mg | INTRAMUSCULAR | Status: DC | PRN
  Administered 2011-12-04: 12.5 mg via INTRAVENOUS
  Filled 2011-12-04: qty 1

## 2011-12-04 MED ORDER — EPHEDRINE 5 MG/ML INJ
INTRAVENOUS | Status: AC
  Filled 2011-12-04: qty 10

## 2011-12-04 MED ORDER — EPHEDRINE SULFATE 50 MG/ML IJ SOLN
INTRAMUSCULAR | Status: DC | PRN
  Administered 2011-12-04 (×5): 10 mg via INTRAVENOUS

## 2011-12-04 MED ORDER — MORPHINE SULFATE 0.5 MG/ML IJ SOLN
INTRAMUSCULAR | Status: AC
  Filled 2011-12-04: qty 10

## 2011-12-04 MED ORDER — LACTATED RINGERS IV SOLN
INTRAVENOUS | Status: DC
  Administered 2011-12-04 (×3): via INTRAVENOUS

## 2011-12-04 MED ORDER — ONDANSETRON HCL 4 MG/2ML IJ SOLN
INTRAMUSCULAR | Status: AC
  Filled 2011-12-04: qty 2

## 2011-12-04 SURGICAL SUPPLY — 26 items
CLOTH BEACON ORANGE TIMEOUT ST (SAFETY) ×2 IMPLANT
DERMABOND ADVANCED (GAUZE/BANDAGES/DRESSINGS) ×1
DERMABOND ADVANCED .7 DNX12 (GAUZE/BANDAGES/DRESSINGS) ×1 IMPLANT
ELECT REM PT RETURN 9FT ADLT (ELECTROSURGICAL) ×2
ELECTRODE REM PT RTRN 9FT ADLT (ELECTROSURGICAL) ×1 IMPLANT
EXTRACTOR VACUUM M CUP 4 TUBE (SUCTIONS) IMPLANT
GLOVE BIO SURGEON STRL SZ8.5 (GLOVE) ×4 IMPLANT
GOWN PREVENTION PLUS LG XLONG (DISPOSABLE) ×4 IMPLANT
GOWN PREVENTION PLUS XXLARGE (GOWN DISPOSABLE) ×2 IMPLANT
KIT ABG SYR 3ML LUER SLIP (SYRINGE) IMPLANT
NEEDLE HYPO 25X5/8 SAFETYGLIDE (NEEDLE) ×2 IMPLANT
NS IRRIG 1000ML POUR BTL (IV SOLUTION) ×2 IMPLANT
PACK C SECTION WH (CUSTOM PROCEDURE TRAY) ×2 IMPLANT
SLEEVE SCD COMPRESS KNEE MED (MISCELLANEOUS) IMPLANT
SUT CHROMIC 0 CT 802H (SUTURE) ×2 IMPLANT
SUT CHROMIC 1 CTX 36 (SUTURE) ×4 IMPLANT
SUT CHROMIC 2 0 SH (SUTURE) ×2 IMPLANT
SUT GUT PLAIN 0 CT-3 TAN 27 (SUTURE) IMPLANT
SUT MON AB 4-0 PS1 27 (SUTURE) ×2 IMPLANT
SUT VIC AB 0 CT1 18XCR BRD8 (SUTURE) IMPLANT
SUT VIC AB 0 CT1 8-18 (SUTURE)
SUT VIC AB 0 CTX 36 (SUTURE) ×2
SUT VIC AB 0 CTX36XBRD ANBCTRL (SUTURE) ×2 IMPLANT
TOWEL OR 17X24 6PK STRL BLUE (TOWEL DISPOSABLE) ×4 IMPLANT
TRAY FOLEY CATH 14FR (SET/KITS/TRAYS/PACK) ×2 IMPLANT
WATER STERILE IRR 1000ML POUR (IV SOLUTION) ×2 IMPLANT

## 2011-12-04 NOTE — Op Note (Signed)
preop diagnosis previous cesarean section at 36 weeks with classical uterine incision Postop diagnosis the same repeat low transverse cesarean section Anesthesia spinal Surgeon Dr. Francoise Ceo First assistant Dr. Coral Ceo Procedure patient placed on the operating table in the supine position after the spinal administered abdomen prepped and draped data entered with a Foley catheter a transverse suprapubic incision made through the old scar carried down to the rectus fascia fascia cleaned and incised the length of the incision recti muscles retracted laterally peritoneum incised longitudinally the bladder flap was pulled up with open the uterus or the day classical incision this was dissected free and the bladder mobilized inferiorly the lower segment was well-developed and a transverse low uterine incision made the fluid was clear patient delivered from the LOA position of a female Apgar 9 and 9 placenta was anterior fundal removed manually and sent to labor and delivery uterine cavity clean and dry laps uterine incision closed in one layer with continuous within normal on chromic hemostasis satisfactory bladder flap reattached to a chromic the uterus was well contracted tubes and ovaries normal abdomen closed in layers peritoneum continuous within of 0 chromic fascia continuous within of 0 Dexon and the skin shows a subcuticular stitch of 4-0 Monocryl blood loss was 800 cc patient tolerated procedure well taken to recovery room in good condition end of dictation dictated by Dr. Francoise Ceo

## 2011-12-04 NOTE — Anesthesia Preprocedure Evaluation (Signed)
Anesthesia Evaluation  Patient identified by MRN, date of birth, ID band Patient awake    Reviewed: Allergy & Precautions, H&P , NPO status , Patient's Chart, lab work & pertinent test results, reviewed documented beta blocker date and time   History of Anesthesia Complications Negative for: history of anesthetic complications  Airway Mallampati: II TM Distance: >3 FB Neck ROM: full    Dental  (+) Teeth Intact   Pulmonary neg pulmonary ROS,  breath sounds clear to auscultation        Cardiovascular negative cardio ROS  Rhythm:regular Rate:Normal     Neuro/Psych PSYCHIATRIC DISORDERS (depression, no meds) negative neurological ROS  negative psych ROS   GI/Hepatic negative GI ROS, Neg liver ROS,   Endo/Other  negative endocrine ROS  Renal/GU negative Renal ROS  negative genitourinary   Musculoskeletal   Abdominal   Peds  Hematology negative hematology ROS (+)   Anesthesia Other Findings   Reproductive/Obstetrics (+) Pregnancy (h/o prior classical c/s, baby did not survive)                           Anesthesia Physical Anesthesia Plan  ASA: II  Anesthesia Plan: Spinal   Post-op Pain Management:    Induction:   Airway Management Planned:   Additional Equipment:   Intra-op Plan:   Post-operative Plan:   Informed Consent: I have reviewed the patients History and Physical, chart, labs and discussed the procedure including the risks, benefits and alternatives for the proposed anesthesia with the patient or authorized representative who has indicated his/her understanding and acceptance.     Plan Discussed with: CRNA and Surgeon  Anesthesia Plan Comments:         Anesthesia Quick Evaluation

## 2011-12-04 NOTE — H&P (Signed)
  There has been no change in the patient's history and physical exam since the original dictation

## 2011-12-04 NOTE — Transfer of Care (Signed)
Immediate Anesthesia Transfer of Care Note  Patient: Anita [name]  Procedure(s) Performed: Procedure(s) (LRB): CESAREAN SECTION (N/A)  Patient Location: PACU  Anesthesia Type: Spinal  Level of Consciousness: awake  Airway & Oxygen Therapy: Patient Spontanous Breathing  Post-op Assessment: Report given to PACU RN  Post vital signs: Reviewed and stable  Complications: No apparent anesthesia complications

## 2011-12-04 NOTE — Anesthesia Procedure Notes (Signed)
Spinal  Patient location during procedure: OR Start time: 12/04/2011 11:36 AM Staffing Performed by: anesthesiologist  Preanesthetic Checklist Completed: patient identified, site marked, surgical consent, pre-op evaluation, timeout performed, IV checked, risks and benefits discussed and monitors and equipment checked Spinal Block Patient position: sitting Prep: site prepped and draped and DuraPrep Patient monitoring: heart rate, cardiac monitor, continuous pulse ox and blood pressure Approach: midline Location: L3-4 Injection technique: single-shot Needle Needle type: Sprotte  Needle gauge: 24 G Needle length: 9 cm Assessment Sensory level: T4 Additional Notes Clear free flow CSF on first attempt.  No paresthesia.  Patient tolerated procedure well.  Jasmine December, MD

## 2011-12-04 NOTE — Anesthesia Postprocedure Evaluation (Signed)
Anesthesia Post Note  Patient: Anita [name]  Procedure(s) Performed: Procedure(s) (LRB): CESAREAN SECTION (N/A)  Anesthesia type: Spinal  Patient location: PACU  Post pain: Pain level controlled  Post assessment: Post-op Vital signs reviewed  Last Vitals:  Filed Vitals:   12/04/11 1315  BP: 96/52  Pulse: 56  Temp:   Resp: 16    Post vital signs: Reviewed  Level of consciousness: awake  Complications: No apparent anesthesia complications

## 2011-12-05 ENCOUNTER — Encounter (HOSPITAL_COMMUNITY): Payer: Self-pay | Admitting: Obstetrics

## 2011-12-05 LAB — CBC
MCH: 32.9 pg (ref 26.0–34.0)
MCHC: 34.8 g/dL (ref 30.0–36.0)
Platelets: 194 10*3/uL (ref 150–400)

## 2011-12-05 NOTE — Addendum Note (Signed)
Addendum  created 12/05/11 0900 by Shanon Payor, CRNA   Modules edited:Notes Section

## 2011-12-05 NOTE — Progress Notes (Signed)
Patient ID: Anita [name], female   DOB: [birth year], 21 y.o.   MRN: [id number] Postop day 1 Blood pressure 90 ampulla 51 she tends to run low blood pressures were pulse 60 respirations 16 hemoglobin 10.1 abdomen soft incision clean and dry legs negative lochia moderate patient has no complaints is doing well

## 2011-12-05 NOTE — Progress Notes (Signed)
Ur chart review completed.  

## 2011-12-05 NOTE — Anesthesia Postprocedure Evaluation (Signed)
  Anesthesia Post-op Note  Patient: Anita [name]  Procedure(s) Performed: Procedure(s) (LRB): CESAREAN SECTION (N/A)  Patient Location: Mother/Baby  Anesthesia Type: Spinal  Level of Consciousness: awake, alert  and oriented  Airway and Oxygen Therapy: Patient Spontanous Breathing  Post-op Pain: none  Post-op Assessment: Post-op Vital signs reviewed and Patient's Cardiovascular Status Stable  Post-op Vital Signs: Reviewed and stable  Complications: No apparent anesthesia complications

## 2011-12-06 ENCOUNTER — Encounter (HOSPITAL_COMMUNITY): Payer: Self-pay | Admitting: *Deleted

## 2011-12-06 NOTE — Progress Notes (Signed)
Patient ID: Anita [name], female   DOB: 01/25/1991, 21 y.o.   MRN: [id number] Postop day 2 Vital signs normal Fundus firm Legs negative No complaints

## 2011-12-07 ENCOUNTER — Inpatient Hospital Stay (HOSPITAL_COMMUNITY): Payer: Medicaid Other

## 2011-12-07 LAB — CBC
MCH: 31.8 pg (ref 26.0–34.0)
MCHC: 33.4 g/dL (ref 30.0–36.0)
MCV: 95.2 fL (ref 78.0–100.0)
Platelets: 259 10*3/uL (ref 150–400)
RBC: 3.33 MIL/uL — ABNORMAL LOW (ref 3.87–5.11)

## 2011-12-07 NOTE — Discharge Summary (Signed)
Obstetric Discharge Summary Reason for Admission: cesarean section Prenatal Procedures: cerclage Intrapartum Procedures: cesarean: low cervical, transverse Postpartum Procedures: none Complications-Operative and Postpartum: none Hemoglobin  Date Value Range Status  12/05/2011 10.1* 12.0 - 15.0 g/dL Final     HCT  Date Value Range Status  12/05/2011 29.0* 36.0 - 46.0 % Final    Physical Exam:  General: alert Lochia: appropriate Uterine Fundus: firm Incision: healing well DVT Evaluation: No evidence of DVT seen on physical exam.  Discharge Diagnoses: Term Pregnancy-delivered  Discharge Information: Date: 12/07/2011 Activity: pelvic rest Diet: routine Medications: Percocet Condition: stable Instructions: refer to practice specific booklet Discharge to: home Follow-up Information    Follow up with Nycholas Rayner A, MD. Call in 6 weeks.   Contact information:   [address] [place] [address] [phone]          Newborn Data: Live born female  Birth Weight: 6 lb (2722 g) APGAR: 9, 9  Home with mother.  Anita [name] A 12/07/2011, 6:46 AM

## 2011-12-07 NOTE — Progress Notes (Signed)
Patient ID: Anita [name], female   DOB: 1990-10-13, 21 y.o.   MRN: [id number] Postop day 3 Vital signs normal Fundus firm Lochia moderate Incision clean Discharge today no complaints

## 2011-12-07 NOTE — Progress Notes (Addendum)
Dr. Gaynell Face notified of patient's complaints of blurred vision when she is trying to read and a headache that she developed this morning.  Per Dr. Gaynell Face the patient needs to see her eye doctor about the blurred vision and she can take pain medication for her headache.  Report already given to oncoming RN, who is aware of patient complaints.  She denies having any dizziness with walking or activity. She verbalizes that she first noticed the blurred vision on yesterday afternoon.  She never mentioned complaints of a headache or blurred vision during the night shift.  Her first complaint was after 7 am.

## 2011-12-07 NOTE — Progress Notes (Signed)
Patient ID: Anita [name], female   DOB: 1991/05/09, 20 y.o.   MRN: [id number] Blood pressure  88/64 pulse 61 respirations 16 when patient was seen this morning she felt fine   When she found out her baby was not going homestarted having dizziness  blurred vision headache repeat CBC showed a hemoglobin is 10.6  and CT of her brain was normal her discharge was canceled at that point  With her normal results   she'll go home today a

## 2011-12-07 NOTE — Progress Notes (Signed)
Dr. Gaynell Face contacted with lab and CT scan results. All within normal limits. Dr. Gaynell Face stated he will put in order to discharge patient.

## 2014-03-22 ENCOUNTER — Encounter (HOSPITAL_COMMUNITY): Payer: Self-pay | Admitting: *Deleted

## 2016-08-23 ENCOUNTER — Inpatient Hospital Stay (HOSPITAL_COMMUNITY): Payer: Self-pay

## 2016-08-23 ENCOUNTER — Encounter (HOSPITAL_COMMUNITY): Payer: Self-pay | Admitting: *Deleted

## 2016-08-23 ENCOUNTER — Inpatient Hospital Stay (HOSPITAL_COMMUNITY)
Admission: AD | Admit: 2016-08-23 | Discharge: 2016-08-23 | Disposition: A | Discharge status: Home / Self Care | Payer: Self-pay | Source: Ambulatory Visit | Attending: Obstetrics & Gynecology | Admitting: Obstetrics & Gynecology

## 2016-08-23 DIAGNOSIS — O26893 Other specified pregnancy related conditions, third trimester: Secondary | ICD-10-CM | POA: Insufficient documentation

## 2016-08-23 DIAGNOSIS — Z3A18 18 weeks gestation of pregnancy: Secondary | ICD-10-CM

## 2016-08-23 DIAGNOSIS — M545 Low back pain: Secondary | ICD-10-CM | POA: Insufficient documentation

## 2016-08-23 DIAGNOSIS — R109 Unspecified abdominal pain: Secondary | ICD-10-CM

## 2016-08-23 DIAGNOSIS — O09219 Supervision of pregnancy with history of pre-term labor, unspecified trimester: Secondary | ICD-10-CM

## 2016-08-23 DIAGNOSIS — O26899 Other specified pregnancy related conditions, unspecified trimester: Secondary | ICD-10-CM

## 2016-08-23 DIAGNOSIS — O09299 Supervision of pregnancy with other poor reproductive or obstetric history, unspecified trimester: Secondary | ICD-10-CM

## 2016-08-23 DIAGNOSIS — O98813 Other maternal infectious and parasitic diseases complicating pregnancy, third trimester: Secondary | ICD-10-CM | POA: Insufficient documentation

## 2016-08-23 DIAGNOSIS — B3731 Acute candidiasis of vulva and vagina: Secondary | ICD-10-CM

## 2016-08-23 DIAGNOSIS — O99332 Smoking (tobacco) complicating pregnancy, second trimester: Secondary | ICD-10-CM | POA: Insufficient documentation

## 2016-08-23 DIAGNOSIS — B373 Candidiasis of vulva and vagina: Secondary | ICD-10-CM | POA: Insufficient documentation

## 2016-08-23 DIAGNOSIS — O26892 Other specified pregnancy related conditions, second trimester: Secondary | ICD-10-CM

## 2016-08-23 DIAGNOSIS — O0930 Supervision of pregnancy with insufficient antenatal care, unspecified trimester: Secondary | ICD-10-CM

## 2016-08-23 LAB — URINALYSIS, ROUTINE W REFLEX MICROSCOPIC
BILIRUBIN URINE: NEGATIVE
GLUCOSE, UA: NEGATIVE mg/dL
Hgb urine dipstick: NEGATIVE
KETONES UR: NEGATIVE mg/dL
Nitrite: NEGATIVE
Protein, ur: NEGATIVE mg/dL
Specific Gravity, Urine: 1.018 (ref 1.005–1.030)
pH: 7 (ref 5.0–8.0)

## 2016-08-23 LAB — WET PREP, GENITAL
Clue Cells Wet Prep HPF POC: NONE SEEN
SPERM: NONE SEEN
TRICH WET PREP: NONE SEEN
Yeast Wet Prep HPF POC: NONE SEEN

## 2016-08-23 LAB — POCT PREGNANCY, URINE: Preg Test, Ur: POSITIVE — AB

## 2016-08-23 MED ORDER — TERCONAZOLE 0.4 % VA CREA: 1.0000 | TOPICAL_CREAM | Freq: Every day | VAGINAL | 0 refills | Status: DC

## 2016-08-23 NOTE — MAU Note (Signed)
Patient complaining of  Back and leg pain that started last night. Also states that she is having some abdominal cramping similar to period cramps. States she was able to sleep last night but was up for a few hours with pain.

## 2016-08-23 NOTE — MAU Provider Note (Signed)
Chief Complaint: Abdominal Pain and Back Pain   First Provider Initiated Contact with Patient 08/23/16 1349      SUBJECTIVE HPI: Anita [name] is a 26 y.o. [phone] with unsure LMP who presents to maternity admissions with known pregnancy but no prenatal care yet reporting lower abdominal and lower back pain.  She has poor obstetric history with 19 week PPROM followed by 26 week delivery by classical C/S and early neonatal demise with her first pregnancy. With her second pregnancy she received a cerclage at [redacted]w[redacted]d and delivered by scheduled repeat C/S at 36 weeks and this is her living child.  She had an early SAB with her third pregnancy. She states that this pain began last night around 5pm.  The pain is located mostly in the lower abdomen, mid-lower back, and is worse while moving or standing.  The pain is "sharp" at its worse and dull and continuous at rest.  She states that she has not taken anything to relieve the pain and that sometimes the pain in her lower back radiates to both of her legs. Additionally, she endorses chills, urinary frequency, yellowish vaginal discharge, and being sexually active without contraception or STI prophylaxis up until she found out that she was pregnant via [facility] Screen on March 16.    She denies vaginal bleeding, vaginal itching/burning, dysuria, h/a, dizziness, n/v, or fever   HPI  Past Medical History:  Diagnosis Date  . Depression    takes no meds   Past Surgical History:  Procedure Laterality Date  . CERVICAL CERCLAGE  07/03/2011   Procedure: CERCLAGE CERVICAL;  Surgeon: Kathreen Cosier, MD;  Location: [facility];  Service: Gynecology;  Laterality: N/A;  . CESAREAN SECTION  2010   32month fetal demise..   . CESAREAN SECTION  12/04/2011   Procedure: CESAREAN SECTION;  Surgeon: Kathreen Cosier, MD;  Location: [facility];  Service: Gynecology;  Laterality: N/A;   Social History   Social History  . Marital status: Single    Spouse  name: N/A  . Number of children: N/A  . Years of education: N/A   Occupational History  . Not on file.   Social History Main Topics  . Smoking status: Former Smoker    Types: Cigarettes    Quit date: 07/02/2010  . Smokeless tobacco: Never Used  . Alcohol use No  . Drug use: No  . Sexual activity: Yes   Other Topics Concern  . Not on file   Social History Narrative  . No narrative on file   No current facility-administered medications on file prior to encounter.    No current outpatient prescriptions on file prior to encounter.   No Known Allergies  ROS:  Review of Systems  Constitutional: Positive for chills. Negative for fatigue and fever.  Respiratory: Negative for chest tightness and shortness of breath.   Cardiovascular: Negative for chest pain and palpitations.  Gastrointestinal: Positive for abdominal pain. Negative for abdominal distention, anal bleeding, blood in stool, constipation, diarrhea, nausea and vomiting.  Genitourinary: Positive for frequency. Negative for difficulty urinating, dysuria, flank pain and urgency.  Musculoskeletal: Positive for back pain. Negative for gait problem, neck pain and neck stiffness.     I have reviewed patient's Past Medical Hx, Surgical Hx, Family Hx, Social Hx, medications and allergies.   Physical Exam   Patient Vitals for the past 24 hrs:  BP Temp Temp src Pulse Resp Height Weight  08/23/16 1758 106/60 - - 93 18 - -  08/23/16 1155 (!) 106/55 98.1 F (36.7 C) Oral 83 16 - -  08/23/16 1024 (!) 103/54 97.9 F (36.6 C) Oral 83 18  (1.6 m) 144 lb (65.3 kg)   Constitutional: Well-developed, well-nourished female in no acute distress.  Cardiovascular: normal rate Respiratory: normal effort GI: Abd soft, non-tender. Pos BS x 4 MS: Extremities nontender, no edema, normal ROM Neurologic: Alert and oriented x 4.  GU: Neg CVAT.  PELVIC EXAM: Cervix pink, visually closed, without lesion, large amount thick white  discharge, vaginal walls and external genitalia normal  Dilation: Closed Effacement (%): Thick Cervical Position: Posterior Exam by:: Collene Gobble CNM   Cervix Length Via Transvaginal Ultrasound: 4.2 cm.  Gestational Age Via Limited OB US (BPD only): 18wks 3days  FHT  by doppler  LAB RESULTS Results for orders placed or performed during the hospital encounter of 08/23/16 (from the past 24 hour(s))  Urinalysis, Routine w reflex microscopic     Status: Abnormal   Collection Time: 08/23/16 10:30 AM  Result Value Ref Range   Color, Urine YELLOW YELLOW   APPearance HAZY (A) CLEAR   Specific Gravity, Urine 1.018 1.005 - 1.030   pH 7.0 5.0 - 8.0   Glucose, UA NEGATIVE NEGATIVE mg/dL   Hgb urine dipstick NEGATIVE NEGATIVE   Bilirubin Urine NEGATIVE NEGATIVE   Ketones, ur NEGATIVE NEGATIVE mg/dL   Protein, ur NEGATIVE NEGATIVE mg/dL   Nitrite NEGATIVE NEGATIVE   Leukocytes, UA TRACE (A) NEGATIVE   RBC / HPF 0-5 0 - 5 RBC/hpf   WBC, UA 0-5 0 - 5 WBC/hpf   Bacteria, UA MANY (A) NONE SEEN   Squamous Epithelial / LPF 0-5 (A) NONE SEEN   Mucous PRESENT   Pregnancy, urine POC     Status: Abnormal   Collection Time: 08/23/16 12:19 PM  Result Value Ref Range   Preg Test, Ur POSITIVE (A) NEGATIVE  Wet prep, genital     Status: Abnormal   Collection Time: 08/23/16  4:15 PM  Result Value Ref Range   Yeast Wet Prep HPF POC NONE SEEN NONE SEEN   Trich, Wet Prep NONE SEEN NONE SEEN   Clue Cells Wet Prep HPF POC NONE SEEN NONE SEEN   WBC, Wet Prep HPF POC MODERATE (A) NONE SEEN   Sperm NONE SEEN        I confirm that I have verified the information documented in the PA student's note and that I have also personally performed the physical exam and all medical decision making activities.   MDM: Dating based on today's [place] but BPD only so will reevaluate after anatomy US.  Pt with cervix 0/thick/high, cervical length of 4.2 cm today.  Wet prep negative but discharge c/w yeast.  Will treat  with Terazol 7 Q HS x 7.  Consult Dr Erin Fulling to review history, assessment, and findings.  No evidence of preterm labor or cervical shortening today.  Pt Ok to follow up as scheduled next week at [facility], at that time provider will review history and discuss options and schedule cerclage if appropriate.  Outpatient anatomy US ordered.  Precautions reviewed/reasons to return to hospital. Pt stable at time of discharge.  A: 1. Candidal vaginitis   2. History of incompetent cervix, currently pregnant   3. Abdominal pain during pregnancy in second trimester   4. Abdominal pain complicating pregnancy   5. Pregnancy with poor obstetric history   6. Late prenatal care affecting pregnancy, antepartum   7. [redacted] weeks gestation of  pregnancy    P: D/C home with preterm labor precautions Return to MAU as needed for emergencies   Sharen Counter, CNM 10:06 PM    Follow-up Information    Center for [facility] Follow up.   Specialty:  Obstetrics and Gynecology Why:  You are scheduled with Dr Vergie Living for your New OB visit on 08/30/16.  Please keep your appointment. Return to MAU as needed for emergencies. Contact information: [address] [place] [address] [phone]

## 2016-08-23 NOTE — MAU Note (Addendum)
Pt started having lower back & leg pain last night, also some lower abd cramping.  Has an [employer] on April 12.  Had cerclage with last pregnancy.  Denies bleeding.  Pt has proof of pregnancy letter from [facility].

## 2016-08-24 LAB — GC/CHLAMYDIA PROBE AMP (~~LOC~~) NOT AT ARMC
CHLAMYDIA, DNA PROBE: NEGATIVE
NEISSERIA GONORRHEA: NEGATIVE

## 2016-08-30 ENCOUNTER — Ambulatory Visit (INDEPENDENT_AMBULATORY_CARE_PROVIDER_SITE_OTHER): Payer: Self-pay | Admitting: Obstetrics and Gynecology

## 2016-08-30 ENCOUNTER — Encounter: Payer: Self-pay | Admitting: Obstetrics and Gynecology

## 2016-08-30 VITALS — BP 106/60 | HR 71 | Wt 144.0 lb

## 2016-08-30 DIAGNOSIS — Z113 Encounter for screening for infections with a predominantly sexual mode of transmission: Secondary | ICD-10-CM

## 2016-08-30 DIAGNOSIS — O0932 Supervision of pregnancy with insufficient antenatal care, second trimester: Secondary | ICD-10-CM

## 2016-08-30 DIAGNOSIS — Z124 Encounter for screening for malignant neoplasm of cervix: Secondary | ICD-10-CM

## 2016-08-30 DIAGNOSIS — Z98891 History of uterine scar from previous surgery: Secondary | ICD-10-CM | POA: Insufficient documentation

## 2016-08-30 DIAGNOSIS — O34219 Maternal care for unspecified type scar from previous cesarean delivery: Secondary | ICD-10-CM

## 2016-08-30 DIAGNOSIS — O0990 Supervision of high risk pregnancy, unspecified, unspecified trimester: Secondary | ICD-10-CM

## 2016-08-30 DIAGNOSIS — O09292 Supervision of pregnancy with other poor reproductive or obstetric history, second trimester: Secondary | ICD-10-CM

## 2016-08-30 DIAGNOSIS — Z1151 Encounter for screening for human papillomavirus (HPV): Secondary | ICD-10-CM

## 2016-08-30 DIAGNOSIS — Z3689 Encounter for other specified antenatal screening: Secondary | ICD-10-CM

## 2016-08-30 DIAGNOSIS — O09299 Supervision of pregnancy with other poor reproductive or obstetric history, unspecified trimester: Secondary | ICD-10-CM

## 2016-08-30 NOTE — Patient Instructions (Signed)

## 2016-08-30 NOTE — Progress Notes (Signed)
New OB Note  08/30/2016   Clinic: [facility]  Chief Complaint: NOB  Transfer of Care Patient: no  History of Present Illness: Anita [name] is a 26 y.o. [id number] @ 19/3 weeks (EDC 19/3, based on 18wk u/s. LMP unknown. Preg complicated by has History of incompetent cervix, currently pregnant; Pregnancy with poor obstetric history; Late prenatal care affecting pregnancy; Supervision of high risk pregnancy, antepartum; Previous cesarean section; and History of classical cesarean section on her problem list.   Any events prior to today's visit: Yes. Patient went to MAU on 4/5 lower belly pain. u/s there showed 18/3wk SLIUP, with TVUS CL of >4cm. WP, GC/CT, u/a negative. Her periods were: irregular She was using no method when she conceived.  She has Negative signs or symptoms of nausea/vomiting of pregnancy. She has Negative signs or symptoms of miscarriage or preterm labor On any different medications around the time she conceived/early pregnancy: No   ROS: A 12-point review of systems was performed and negative, except as stated in the above HPI.  OBGYN History: As per HPI. OB History  Gravida Para Term Preterm AB Living  4 2 0 SAB TAB Ectopic Multiple Live Births  1       2    # Outcome Date GA Lbr Len/2nd Weight Sex Delivery Anes PTL Lv  4 Current           3 Preterm 12/04/11 [redacted]w[redacted]d  6 lb (2.722 kg) F CS-LTranv Spinal  LIV  2 Preterm  [redacted]w[redacted]d    CS-Unspec  Y ND  1 SAB             Obstetric Comments  1st pregnancy, water broke, hospitalized, abx, then bleeding and C/S  2nd pregnancy, loss at 4 weeks  3rd pregnancy, Cerclage due to previous losses then repeat C/S   Pap 2013: negative.    Past Medical History: Past Medical History:  Diagnosis Date  . Depression    takes no meds    Past Surgical History: Past Surgical History:  Procedure Laterality Date  . CERVICAL CERCLAGE  07/03/2011   Procedure: CERCLAGE CERVICAL;  Surgeon: Kathreen Cosier, MD;  Location: [facility];  Service: Gynecology;  Laterality: N/A;  . CESAREAN SECTION  2010   61month fetal demise..   . CESAREAN SECTION  12/04/2011   Procedure: CESAREAN SECTION;  Surgeon: Kathreen Cosier, MD;  Location: [facility];  Service: Gynecology;  Laterality: N/A;    Family History:  No family history on file.  Social History:  Social History   Social History  . Marital status: Single    Spouse name: N/A  . Number of children: N/A  . Years of education: N/A   Occupational History  . Not on file.   Social History Main Topics  . Smoking status: Former Smoker    Types: Cigarettes    Quit date: 07/02/2010  . Smokeless tobacco: Never Used  . Alcohol use No  . Drug use: No  . Sexual activity: Yes   Other Topics Concern  . Not on file   Social History Narrative  . No narrative on file    Allergy: No Known Allergies  Current Outpatient Medications: PNV  Physical Exam:   BP 106/60   Pulse 71   Wt 144 lb (65.3 kg)   LMP  (LMP Unknown) Comment: irregular periods, doesn't know LMP  BMI 25.51 kg/m  Body mass index is 25.51 kg/m. Fundal height: 20 FHTs:  140s  General appearance: Well nourished, well developed female in no acute distress.  Neck:  Supple, normal appearance, and no thyromegaly  Cardiovascular: S1, S2 normal, no murmur, rub or gallop, regular rate and rhythm Respiratory:  Clear to auscultation bilateral. Normal respiratory effort Abdomen: positive bowel sounds and no masses, hernias; diffusely non tender to palpation, non distended Breasts: breasts appear normal, no suspicious masses, no skin or nipple changes or axillary nodes, and normal inspection. Neuro/Psych:  Normal mood and affect.  Skin:  Warm and dry.  Lymphatic:  No inguinal lymphadenopathy.   Pelvic exam: is not limited by body habitus EGBUS: within normal limits, Vagina: within normal limits and with no blood in the vault, Cervix: normal appearing cervix without discharge or  lesions, closed/long/high, Uterus:  enlarged, c/w 20 week size, and Adnexa:  normal adnexa and no mass, fullness, tenderness  Laboratory: See above  Imaging:  See above  Assessment: pt stabld  Plan: 1. Supervision of high risk pregnancy, antepartum Routine care. Pt amenable to genetic screening. Anatomy u/s with CL ordered. Desires BTL - [place] bedside; Future - [place] MFM OB COMP + 14 WK; Future - Cytology - PAP - AFP, Quad Screen - Urine Culture - Comprehensive metabolic panel - Protein / creatinine ratio, urine - Hemoglobinopathy Evaluation - Cystic fibrosis gene test - SMN1 Copy Number Analysis - Obstetric Panel, Including HIV - Hemoglobin A1c  2. Previous cesarean section See below  3. Late prenatal care affecting pregnancy in second trimester States she didn't know she was pregnant until most recently.  4. History of classical cesarean section Needs rpt at 36-37wks  5. History of incompetent cervix, currently pregnant h/o G1 19wk PPROM and then 24wks classical c-section with NN demise. Patient had 14wk ppx in 2013 pregnancy with mersilene tape with dr Gaynell Face; she states she wasn't on 17p during that pregnancy. Had brief consultation with MFM with her u/s on 4/5. Pt would like to do 17p and serial CLs and follow closely and not do ppx cerclage. Will schedule her for rpt CL in 1wk, and continue them q2wk until 26-28wks and intervene for CL <2.5cm; can consider vaginal progesterone if <3cm. Will do qwk 17p once vial comes in.   Problem list reviewed and updated.  Follow up in 3 weeks.  >50% of 30 min visit spent on counseling and coordination of care.     Cornelia Copa MD Attending Center for [facility])

## 2016-08-31 LAB — HEMOGLOBIN A1C
Est. average glucose Bld gHb Est-mCnc: 82 mg/dL
HEMOGLOBIN A1C: 4.5 % — AB (ref 4.8–5.6)

## 2016-09-01 LAB — URINE CULTURE: Organism ID, Bacteria: NO GROWTH

## 2016-09-04 LAB — COMPREHENSIVE METABOLIC PANEL
ALBUMIN: 3.7 g/dL (ref 3.5–5.5)
ALK PHOS: 65 IU/L (ref 39–117)
ALT: 19 IU/L (ref 0–32)
AST: 21 IU/L (ref 0–40)
Albumin/Globulin Ratio: 1.2 (ref 1.2–2.2)
BUN/Creatinine Ratio: 9 (ref 9–23)
BUN: 5 mg/dL — AB (ref 6–20)
Bilirubin Total: 0.4 mg/dL (ref 0.0–1.2)
CO2: 22 mmol/L (ref 18–29)
CREATININE: 0.54 mg/dL — AB (ref 0.57–1.00)
Calcium: 9.2 mg/dL (ref 8.7–10.2)
Chloride: 101 mmol/L (ref 96–106)
GFR calc Af Amer: 150 mL/min/{1.73_m2} (ref >59)
GFR calc non Af Amer: 130 mL/min/{1.73_m2} (ref >59)
GLUCOSE: 71 mg/dL (ref 65–99)
Globulin, Total: 3 g/dL (ref 1.5–4.5)
Potassium: 4.3 mmol/L (ref 3.5–5.2)
Sodium: 140 mmol/L (ref 134–144)
Total Protein: 6.7 g/dL (ref 6.0–8.5)

## 2016-09-04 LAB — OBSTETRIC PANEL, INCLUDING HIV
Antibody Screen: NEGATIVE
BASOS ABS: 0 10*3/uL (ref 0.0–0.2)
BASOS: 0 %
EOS (ABSOLUTE): 0 10*3/uL (ref 0.0–0.4)
Eos: 0 %
HEMATOCRIT: 36.5 % (ref 34.0–46.6)
HEMOGLOBIN: 12.2 g/dL (ref 11.1–15.9)
HEP B S AG: NEGATIVE
HIV SCREEN 4TH GENERATION: NONREACTIVE
Immature Grans (Abs): 0 10*3/uL (ref 0.0–0.1)
Immature Granulocytes: 0 %
LYMPHS: 19 %
Lymphocytes Absolute: 1.5 10*3/uL (ref 0.7–3.1)
MCH: 32.5 pg (ref 26.6–33.0)
MCHC: 33.4 g/dL (ref 31.5–35.7)
MCV: 97 fL (ref 79–97)
MONOCYTES: 3 %
Monocytes Absolute: 0.2 10*3/uL (ref 0.1–0.9)
NEUTROS ABS: 6.2 10*3/uL (ref 1.4–7.0)
Neutrophils: 78 %
Platelets: 267 10*3/uL (ref 150–379)
RBC: 3.75 x10E6/uL — ABNORMAL LOW (ref 3.77–5.28)
RDW: 14.3 % (ref 12.3–15.4)
RPR: NONREACTIVE
RUBELLA: 9.28 {index} (ref >0.99)
Rh Factor: POSITIVE
WBC: 8 10*3/uL (ref 3.4–10.8)

## 2016-09-04 LAB — HIV ANTIBODY (ROUTINE TESTING W REFLEX): HIV Screen 4th Generation wRfx: NONREACTIVE

## 2016-09-04 LAB — AFP, QUAD SCREEN
DIA MOM VALUE: 1.54
DIA Value (EIA): 287.37 pg/mL
DSR (BY AGE) 1 IN: 835
DSR (Second Trimester) 1 IN: 2118
Gestational Age: 19.3 WEEKS
MATERNAL AGE AT EDD: 28.3 a
MSAFP MOM: 1.09
MSAFP: 57.2 ng/mL
MSHCG Mom: 1.47
MSHCG: 37203 m[IU]/mL
OSB RISK: 9231
T18 (By Age): 1:3252 {titer}
TEST RESULTS AFP: NEGATIVE
WEIGHT: 144 [lb_av]
uE3 Mom: 1.12
uE3 Value: 1.82 ng/mL

## 2016-09-04 LAB — PROTEIN / CREATININE RATIO, URINE
CREATININE, UR: 92.4 mg/dL
PROTEIN/CREAT RATIO: 69 mg/g{creat} (ref 0–200)
Protein, Ur: 6.4 mg/dL

## 2016-09-04 LAB — CYTOLOGY - PAP
CHLAMYDIA, DNA PROBE: NEGATIVE
Diagnosis: UNDETERMINED — AB
HPV: NOT DETECTED
NEISSERIA GONORRHEA: NEGATIVE

## 2016-09-04 LAB — CYSTIC FIBROSIS GENE TEST

## 2016-09-04 NOTE — Progress Notes (Signed)
Spoke to lab, will be recalculating and should have result by tomorrow, thanks.

## 2016-09-05 LAB — HEMOGLOBINOPATHY EVALUATION
Ferritin: 47 ng/mL (ref 15–150)
HEMOGLOBIN: 12.1 g/dL (ref 11.1–15.9)
HGB S: 0 %
HGB VARIANT: 0 %
Hematocrit: 35.8 % (ref 34.0–46.6)
Hgb A2 Quant: 2.8 % (ref 1.8–3.2)
Hgb A: 97.2 % (ref 96.4–98.8)
Hgb C: 0 %
Hgb F Quant: 0 % (ref 0.0–2.0)
Hgb Solubility: NEGATIVE
MCH: 32.2 pg (ref 26.6–33.0)
MCHC: 33.8 g/dL (ref 31.5–35.7)
MCV: 95 fL (ref 79–97)
PLATELETS: 269 10*3/uL (ref 150–379)
RBC: 3.76 x10E6/uL — AB (ref 3.77–5.28)
RDW: 14.4 % (ref 12.3–15.4)
WBC: 7.4 10*3/uL (ref 3.4–10.8)

## 2016-09-06 LAB — SMN1 COPY NUMBER ANALYSIS (SMA CARRIER SCREENING)

## 2016-09-07 ENCOUNTER — Other Ambulatory Visit: Payer: Self-pay | Admitting: Obstetrics and Gynecology

## 2016-09-07 ENCOUNTER — Encounter (HOSPITAL_COMMUNITY): Payer: Self-pay

## 2016-09-07 ENCOUNTER — Ambulatory Visit (HOSPITAL_COMMUNITY)
Admission: RE | Admit: 2016-09-07 | Discharge: 2016-09-07 | Disposition: A | Discharge status: Home / Self Care | Payer: Self-pay | Source: Ambulatory Visit | Attending: Obstetrics and Gynecology | Admitting: Obstetrics and Gynecology

## 2016-09-07 DIAGNOSIS — O0990 Supervision of high risk pregnancy, unspecified, unspecified trimester: Secondary | ICD-10-CM

## 2016-09-07 DIAGNOSIS — O0932 Supervision of pregnancy with insufficient antenatal care, second trimester: Secondary | ICD-10-CM

## 2016-09-07 DIAGNOSIS — Z3A20 20 weeks gestation of pregnancy: Secondary | ICD-10-CM | POA: Insufficient documentation

## 2016-09-07 DIAGNOSIS — Z98891 History of uterine scar from previous surgery: Secondary | ICD-10-CM

## 2016-09-07 DIAGNOSIS — O09299 Supervision of pregnancy with other poor reproductive or obstetric history, unspecified trimester: Secondary | ICD-10-CM

## 2016-09-20 ENCOUNTER — Ambulatory Visit (INDEPENDENT_AMBULATORY_CARE_PROVIDER_SITE_OTHER): Payer: Medicaid Other | Admitting: Obstetrics and Gynecology

## 2016-09-20 VITALS — BP 104/61 | HR 74 | Wt 149.0 lb

## 2016-09-20 DIAGNOSIS — O0932 Supervision of pregnancy with insufficient antenatal care, second trimester: Secondary | ICD-10-CM

## 2016-09-20 DIAGNOSIS — Z98891 History of uterine scar from previous surgery: Secondary | ICD-10-CM

## 2016-09-20 DIAGNOSIS — O0990 Supervision of high risk pregnancy, unspecified, unspecified trimester: Secondary | ICD-10-CM

## 2016-09-20 DIAGNOSIS — O09299 Supervision of pregnancy with other poor reproductive or obstetric history, unspecified trimester: Secondary | ICD-10-CM

## 2016-09-20 NOTE — Progress Notes (Signed)
Prenatal Visit Note Date: 09/20/2016 Clinic[facility]  Subjective:  Anita [name] is a 26 y.o. [phone] at 6921w3d being seen today for ongoing prenatal care.  She is currently monitored for the following issues for this high-risk pregnancy and has History of incompetent cervix, currently pregnant; Pregnancy with poor obstetric history; Late prenatal care affecting pregnancy; Supervision of high risk pregnancy, antepartum; Previous cesarean section; and History of classical cesarean section on her problem list.  Patient reports no complaints.   Contractions: Not present. Vag. Bleeding: None.  Movement: Present. Denies leaking of fluid.   The following portions of the patient's history were reviewed and updated as appropriate: allergies, current medications, past family history, past medical history, past social history, past surgical history and problem list. Problem list updated.  Objective:   Vitals:   09/20/16 0845  BP: 104/61  Pulse: 74  Weight: 149 lb (67.6 kg)    Fetal Status:     Movement: Present     General:  Alert, oriented and cooperative. Patient is in no acute distress.  Skin: Skin is warm and dry. No rash noted.   Cardiovascular: Normal heart rate noted  Respiratory: Normal respiratory effort, no problems with respiration noted  Abdomen: Soft, gravid, appropriate for gestational age. Pain/Pressure: Absent     Pelvic:  Cervical exam deferred        Extremities: Normal range of motion.  Edema: None  Mental Status: Normal mood and affect. Normal behavior. Normal judgment and thought content.   Urinalysis:      Assessment and Plan:  Pregnancy: [id number] at 4921w3d  1. Supervision of high risk pregnancy, antepartum Routine care. BTL papers at 24-28wks  2. History of classical cesarean section Needs rpt at 36-37wks  3. History of incompetent cervix, currently pregnant h/o G1 19wk PPROM and then 24wks classical c-section with NN  demise. Patient had 14wk ppx in 2013 pregnancy with mersilene tape with dr Gaynell Facemarshall; she states she wasn't on 17p during that pregnancy. Had brief consultation with MFM with her u/s on 4/5.   [facility] before 20/6 wks. CL on 4/20 >2cm and she has rpt tomorrow. Will continue to follow to 28wks and do vag progesterone or cerclage based on GA/PRN.   4. Late prenatal care affecting pregnancy in second trimester See above.   Preterm labor symptoms and general obstetric precautions including but not limited to vaginal bleeding, contractions, leaking of fluid and fetal movement were reviewed in detail with the patient. Please refer to After Visit Summary for other counseling recommendations.  Return in about 3 weeks (around 10/11/2016) for 3-4wk rob.   Summer Shade Bingharlie Dominica Kent, MD

## 2016-09-21 ENCOUNTER — Ambulatory Visit (HOSPITAL_COMMUNITY)
Admission: RE | Admit: 2016-09-21 | Discharge: 2016-09-21 | Disposition: A | Discharge status: Home / Self Care | Payer: Medicaid Other | Source: Ambulatory Visit | Attending: Obstetrics and Gynecology | Admitting: Obstetrics and Gynecology

## 2016-09-21 ENCOUNTER — Encounter (HOSPITAL_COMMUNITY): Payer: Self-pay

## 2016-09-21 DIAGNOSIS — O0932 Supervision of pregnancy with insufficient antenatal care, second trimester: Secondary | ICD-10-CM

## 2016-09-21 DIAGNOSIS — O09299 Supervision of pregnancy with other poor reproductive or obstetric history, unspecified trimester: Secondary | ICD-10-CM

## 2016-09-21 DIAGNOSIS — O0990 Supervision of high risk pregnancy, unspecified, unspecified trimester: Secondary | ICD-10-CM | POA: Diagnosis not present

## 2016-09-21 DIAGNOSIS — Z3A22 22 weeks gestation of pregnancy: Secondary | ICD-10-CM | POA: Diagnosis not present

## 2016-09-21 DIAGNOSIS — Z98891 History of uterine scar from previous surgery: Secondary | ICD-10-CM

## 2016-09-21 NOTE — Addendum Note (Signed)
Encounter addended by: Emeline DarlingKasie E Karson Reede on: 09/21/2016  3:09 PM<BR>    Actions taken: Imaging Exam ended

## 2016-10-03 ENCOUNTER — Ambulatory Visit (INDEPENDENT_AMBULATORY_CARE_PROVIDER_SITE_OTHER): Payer: Medicaid Other | Admitting: *Deleted

## 2016-10-03 DIAGNOSIS — O09212 Supervision of pregnancy with history of pre-term labor, second trimester: Secondary | ICD-10-CM | POA: Diagnosis not present

## 2016-10-03 DIAGNOSIS — O09892 Supervision of other high risk pregnancies, second trimester: Secondary | ICD-10-CM

## 2016-10-03 MED ORDER — HYDROXYPROGESTERONE CAPROATE 250 MG/ML IM OIL
250.0000 mg | TOPICAL_OIL | INTRAMUSCULAR | Status: DC
  Administered 2016-10-03 – 2016-12-07 (×6): 250 mg via INTRAMUSCULAR

## 2016-10-03 NOTE — Progress Notes (Signed)
Pt is here today to initiate weekly 17P injection.

## 2016-10-05 ENCOUNTER — Ambulatory Visit (HOSPITAL_COMMUNITY): Admission: RE | Admit: 2016-10-05 | Payer: Self-pay | Source: Ambulatory Visit

## 2016-10-10 ENCOUNTER — Ambulatory Visit: Payer: Self-pay

## 2016-10-11 ENCOUNTER — Ambulatory Visit (INDEPENDENT_AMBULATORY_CARE_PROVIDER_SITE_OTHER): Payer: Medicaid Other | Admitting: Obstetrics and Gynecology

## 2016-10-11 VITALS — BP 103/56 | HR 70 | Wt 152.0 lb

## 2016-10-11 DIAGNOSIS — O09292 Supervision of pregnancy with other poor reproductive or obstetric history, second trimester: Secondary | ICD-10-CM

## 2016-10-11 DIAGNOSIS — O09299 Supervision of pregnancy with other poor reproductive or obstetric history, unspecified trimester: Secondary | ICD-10-CM

## 2016-10-11 DIAGNOSIS — O0932 Supervision of pregnancy with insufficient antenatal care, second trimester: Secondary | ICD-10-CM

## 2016-10-11 DIAGNOSIS — Z98891 History of uterine scar from previous surgery: Secondary | ICD-10-CM

## 2016-10-11 DIAGNOSIS — O0992 Supervision of high risk pregnancy, unspecified, second trimester: Secondary | ICD-10-CM

## 2016-10-11 DIAGNOSIS — O34219 Maternal care for unspecified type scar from previous cesarean delivery: Secondary | ICD-10-CM

## 2016-10-11 DIAGNOSIS — O0990 Supervision of high risk pregnancy, unspecified, unspecified trimester: Secondary | ICD-10-CM

## 2016-10-11 NOTE — Progress Notes (Signed)
   PRENATAL VISIT NOTE  Subjective:  Anita [name] is a 26 y.o. [phone] at 32w3dbeing seen today for ongoing prenatal care.  She is currently monitored for the following issues for this high-risk pregnancy and has History of incompetent cervix, currently pregnant; Pregnancy with poor obstetric history; Late prenatal care affecting pregnancy; Supervision of high risk pregnancy, antepartum; Previous cesarean section; and History of classical cesarean section on her problem list.  Patient reports no complaints.  Contractions: Not present. Vag. Bleeding: None.  Movement: Present. Denies leaking of fluid.   The following portions of the patient's history were reviewed and updated as appropriate: allergies, current medications, past family history, past medical history, past social history, past surgical history and problem list. Problem list updated.  Objective:   Vitals:   10/11/16 1041  BP: (!) 103/56  Pulse: 70  Weight: 152 lb (68.9 kg)    Fetal Status:     Movement: Present     General:  Alert, oriented and cooperative. Patient is in no acute distress.  Skin: Skin is warm and dry. No rash noted.   Cardiovascular: Normal heart rate noted  Respiratory: Normal respiratory effort, no problems with respiration noted  Abdomen: Soft, gravid, appropriate for gestational age. Pain/Pressure: Absent     Pelvic:  Cervical exam deferred        Extremities: Normal range of motion.  Edema: Trace  Mental Status: Normal mood and affect. Normal behavior. Normal judgment and thought content.   Assessment and Plan:  Pregnancy: [id number] 223w3d1. Late prenatal care affecting pregnancy in second trimester   2. History of classical cesarean section Plan for repeat at 37 weeks. [place] coordinator to discuss payment plan Patient also desires to meet surgeon prior to c-section if not already met  3. Supervision of high  risk pregnancy, antepartum Patient is doing well without complaints  4. History of incompetent cervix, currently pregnant Continue weekly 17-P Follow up cervical length on 6/1  Preterm labor symptoms and general obstetric precautions including but not limited to vaginal bleeding, contractions, leaking of fluid and fetal movement were reviewed in detail with the patient. Please refer to After Visit Summary for other counseling recommendations.  Return in about 3 weeks (around 11/01/2016) for ROB, 2 hr glucola next visit.   PeMora BellmanMD

## 2016-10-17 ENCOUNTER — Ambulatory Visit (INDEPENDENT_AMBULATORY_CARE_PROVIDER_SITE_OTHER): Payer: Medicaid Other | Admitting: *Deleted

## 2016-10-17 DIAGNOSIS — O09212 Supervision of pregnancy with history of pre-term labor, second trimester: Secondary | ICD-10-CM | POA: Diagnosis not present

## 2016-10-17 DIAGNOSIS — O09892 Supervision of other high risk pregnancies, second trimester: Secondary | ICD-10-CM

## 2016-10-19 ENCOUNTER — Ambulatory Visit (HOSPITAL_COMMUNITY)
Admission: RE | Admit: 2016-10-19 | Discharge: 2016-10-19 | Disposition: A | Discharge status: Home / Self Care | Payer: Medicaid Other | Source: Ambulatory Visit | Attending: Obstetrics and Gynecology | Admitting: Obstetrics and Gynecology

## 2016-10-19 ENCOUNTER — Encounter (HOSPITAL_COMMUNITY): Payer: Self-pay

## 2016-10-19 ENCOUNTER — Other Ambulatory Visit: Payer: Self-pay | Admitting: Obstetrics and Gynecology

## 2016-10-19 DIAGNOSIS — Z3A26 26 weeks gestation of pregnancy: Secondary | ICD-10-CM | POA: Diagnosis not present

## 2016-10-19 DIAGNOSIS — Z98891 History of uterine scar from previous surgery: Secondary | ICD-10-CM | POA: Insufficient documentation

## 2016-10-19 DIAGNOSIS — O09212 Supervision of pregnancy with history of pre-term labor, second trimester: Secondary | ICD-10-CM

## 2016-10-19 DIAGNOSIS — O09292 Supervision of pregnancy with other poor reproductive or obstetric history, second trimester: Secondary | ICD-10-CM

## 2016-10-19 DIAGNOSIS — O0932 Supervision of pregnancy with insufficient antenatal care, second trimester: Secondary | ICD-10-CM | POA: Diagnosis not present

## 2016-10-19 DIAGNOSIS — O0990 Supervision of high risk pregnancy, unspecified, unspecified trimester: Secondary | ICD-10-CM

## 2016-10-19 DIAGNOSIS — O0992 Supervision of high risk pregnancy, unspecified, second trimester: Secondary | ICD-10-CM | POA: Diagnosis not present

## 2016-10-19 DIAGNOSIS — O09299 Supervision of pregnancy with other poor reproductive or obstetric history, unspecified trimester: Secondary | ICD-10-CM | POA: Insufficient documentation

## 2016-10-24 ENCOUNTER — Ambulatory Visit: Payer: Self-pay

## 2016-10-25 ENCOUNTER — Ambulatory Visit: Payer: Self-pay

## 2016-10-31 ENCOUNTER — Encounter (HOSPITAL_COMMUNITY): Payer: Self-pay

## 2016-10-31 ENCOUNTER — Ambulatory Visit (INDEPENDENT_AMBULATORY_CARE_PROVIDER_SITE_OTHER): Payer: Medicaid Other | Admitting: Family Medicine

## 2016-10-31 VITALS — BP 101/65 | HR 71 | Wt 153.0 lb

## 2016-10-31 DIAGNOSIS — O09299 Supervision of pregnancy with other poor reproductive or obstetric history, unspecified trimester: Secondary | ICD-10-CM

## 2016-10-31 DIAGNOSIS — Z98891 History of uterine scar from previous surgery: Secondary | ICD-10-CM

## 2016-10-31 DIAGNOSIS — O0990 Supervision of high risk pregnancy, unspecified, unspecified trimester: Secondary | ICD-10-CM

## 2016-10-31 DIAGNOSIS — O0933 Supervision of pregnancy with insufficient antenatal care, third trimester: Secondary | ICD-10-CM

## 2016-10-31 DIAGNOSIS — Z23 Encounter for immunization: Secondary | ICD-10-CM

## 2016-10-31 DIAGNOSIS — O0993 Supervision of high risk pregnancy, unspecified, third trimester: Secondary | ICD-10-CM

## 2016-10-31 DIAGNOSIS — O09293 Supervision of pregnancy with other poor reproductive or obstetric history, third trimester: Secondary | ICD-10-CM

## 2016-10-31 NOTE — Progress Notes (Signed)
   PRENATAL VISIT NOTE  Subjective:  Anita [name] is a 26 y.o. [phone] at 4257w2d being seen today for ongoing prenatal care.  She is currently monitored for the following issues for this high-risk pregnancy and has History of incompetent cervix, currently pregnant; Pregnancy with poor obstetric history; Late prenatal care affecting pregnancy; Supervision of high risk pregnancy, antepartum; Previous cesarean section; and History of classical cesarean section on her problem list.  Patient reports no complaints.  Contractions: Not present. Vag. Bleeding: None.  Movement: Present. Denies leaking of fluid.   The following portions of the patient's history were reviewed and updated as appropriate: allergies, current medications, past family history, past medical history, past social history, past surgical history and problem list. Problem list updated.  Objective:   Vitals:   10/31/16 0840  BP: 101/65  Pulse: 71  Weight: 153 lb (69.4 kg)    Fetal Status: Fetal Heart Rate (bpm): 151   Movement: Present     General:  Alert, oriented and cooperative. Patient is in no acute distress.  Skin: Skin is warm and dry. No rash noted.   Cardiovascular: Normal heart rate noted  Respiratory: Normal respiratory effort, no problems with respiration noted  Abdomen: Soft, gravid, appropriate for gestational age. Pain/Pressure: Absent     Pelvic:  Cervical exam deferred        Extremities: Normal range of motion.  Edema: None  Mental Status: Normal mood and affect. Normal behavior. Normal judgment and thought content.   Assessment and Plan:  Pregnancy: [id number] at 6357w2d  1. History of incompetent cervix, currently pregnant - no bleeding or s/sx labor - last cervical length on 6/1 was 3.64cm  2. Late prenatal care affecting pregnancy in third trimester  3. History of classical cesarean section Plan for repeat CS at 37 week (August 13/14-- requested today)  4. Supervision of high risk  pregnancy, antepartum Needs 28 wk labs, but cannot stay. Will return tomorrow for labs Discussed BTL, patient will talk to clerical about [employer]-- ~400. Recommended pt start paying now for BTL.   Preterm labor symptoms and general obstetric precautions including but not limited to vaginal bleeding, contractions, leaking of fluid and fetal movement were reviewed in detail with the patient. Please refer to After Visit Summary for other counseling recommendations.  Return in about 2 weeks (around 11/14/2016) for Routine prenatal care.   Federico FlakeKimberly Niles Imaya Duffy, MD

## 2016-10-31 NOTE — Patient Instructions (Signed)
Tercer trimestre de embarazo (Third Trimester of Pregnancy) El tercer trimestre comprende desde la semana29 hasta la semana42, es decir, desde el mes7 hasta el mes9. El tercer trimestre es un perodo en el que el feto crece rpidamente. Hacia el final del noveno mes, el feto mide alrededor de 20pulgadas (45cm) de largo y pesa entre 6 y 10 libras (2,700 y 4,500kg). CAMBIOS EN EL ORGANISMO Su organismo atraviesa por muchos cambios durante el embarazo, y estos varan de una mujer a otra.  Seguir aumentando de peso. Es de esperar que aumente entre 25 y 35libras (11 y 16kg) hacia el final del embarazo.  Podrn aparecer las primeras estras en las caderas, el abdomen y las mamas.  Puede tener necesidad de orinar con ms frecuencia porque el feto baja hacia la pelvis y ejerce presin sobre la vejiga.  Debido al embarazo podr sentir acidez estomacal con frecuencia.  Puede estar estreida, ya que ciertas hormonas enlentecen los movimientos de los msculos que empujan los desechos a travs de los intestinos.  Pueden aparecer hemorroides o abultarse e hincharse las venas (venas varicosas).  Puede sentir dolor plvico debido al aumento de peso y a que las hormonas del embarazo relajan las articulaciones entre los huesos de la pelvis. El dolor de espalda puede ser consecuencia de la sobrecarga de los msculos que soportan la postura.  Tal vez haya cambios en el cabello que pueden incluir su engrosamiento, crecimiento rpido y cambios en la textura. Adems, a algunas mujeres se les cae el cabello durante o despus del embarazo, o tienen el cabello seco o fino. Lo ms probable es que el cabello se le normalice despus del nacimiento del beb.  Las mamas seguirn creciendo y le dolern. A veces, puede haber una secrecin amarilla de las mamas llamada calostro.  El ombligo puede salir hacia afuera.  Puede sentir que le falta el aire debido a que se expande el tero.  Puede notar que el feto  "baja" o lo siente ms bajo, en el abdomen.  Puede tener una prdida de secrecin mucosa con sangre. Esto suele ocurrir en el trmino de unos pocos das a una semana antes de que comience el trabajo de parto.  El cuello del tero se vuelve delgado y blando (se borra) cerca de la fecha de parto. QU DEBE ESPERAR EN LOS EXMENES PRENATALES Le harn exmenes prenatales cada 2semanas hasta la semana36. A partir de ese momento le harn exmenes semanales. Durante una visita prenatal de rutina:  La pesarn para asegurarse de que usted y el feto estn creciendo normalmente.  Le tomarn la presin arterial.  Le medirn el abdomen para controlar el desarrollo del beb.  Se escucharn los latidos cardacos fetales.  Se evaluarn los resultados de los estudios solicitados en visitas anteriores.  Le revisarn el cuello del tero cuando est prxima la fecha de parto para controlar si este se ha borrado. Alrededor de la semana36, el mdico le revisar el cuello del tero. Al mismo tiempo, realizar un anlisis de las secreciones del tejido vaginal. Este examen es para determinar si hay un tipo de bacteria, estreptococo Grupo B. El mdico le explicar esto con ms detalle. El mdico puede preguntarle lo siguiente:  Cmo le gustara que fuera el parto.  Cmo se siente.  Si siente los movimientos del beb.  Si ha tenido sntomas anormales, como prdida de lquido, sangrado, dolores de cabeza intensos o clicos abdominales.  Si est consumiendo algn producto que contenga tabaco, como cigarrillos, tabaco de mascar y   cigarrillos electrnicos.  Si tiene alguna pregunta. Otros exmenes o estudios de deteccin que pueden realizarse durante el tercer trimestre incluyen lo siguiente:  Anlisis de sangre para controlar los niveles de hierro (anemia).  Controles fetales para determinar su salud, nivel de actividad y crecimiento. Si tiene alguna enfermedad o hay problemas durante el embarazo, le harn  estudios.  Prueba del VIH (virus de inmunodeficiencia humana). Si corre un riesgo alto, pueden realizarle una prueba de deteccin del VIH durante el tercer trimestre del embarazo. FALSO TRABAJO DE PARTO Es posible que sienta contracciones leves e irregulares que finalmente desaparecen. Se llaman contracciones de Braxton Hicks o falso trabajo de parto. Las contracciones pueden durar horas, das o incluso semanas, antes de que el verdadero trabajo de parto se inicie. Si las contracciones ocurren a intervalos regulares, se intensifican o se hacen dolorosas, lo mejor es que la revise el mdico. SIGNOS DE TRABAJO DE PARTO  Clicos de tipo menstrual.  Contracciones cada 5minutos o menos.  Contracciones que comienzan en la parte superior del tero y se extienden hacia abajo, a la zona inferior del abdomen y la espalda.  Sensacin de mayor presin en la pelvis o dolor de espalda.  Una secrecin de mucosidad acuosa o con sangre que sale de la vagina. Si tiene alguno de estos signos antes de la semana37 del embarazo, llame a su mdico de inmediato. Debe concurrir al hospital para que la controlen inmediatamente. INSTRUCCIONES PARA EL CUIDADO EN EL HOGAR  Evite fumar, consumir hierbas, beber alcohol y tomar frmacos que no le hayan recetado. Estas sustancias qumicas afectan la formacin y el desarrollo del beb.  No consuma ningn producto que contenga tabaco, lo que incluye cigarrillos, tabaco de mascar y cigarrillos electrnicos. Si necesita ayuda para dejar de fumar, consulte al mdico. Puede recibir asesoramiento y otro tipo de recursos para dejar de fumar.  Siga las indicaciones del mdico en relacin con el uso de medicamentos. Durante el embarazo, hay medicamentos que son seguros de tomar y otros que no.  Haga ejercicio solamente como se lo haya indicado el mdico. Sentir clicos uterinos es un buen signo para detener la actividad fsica.  Contine comiendo alimentos sanos con  regularidad.  Use un sostn que le brinde buen soporte si le duelen las mamas.  No se d baos de inmersin en agua caliente, baos turcos ni saunas.  Use el cinturn de seguridad en todo momento mientras conduce.  No coma carne cruda ni queso sin cocinar; evite el contacto con las bandejas sanitarias de los gatos y la tierra que estos animales usan. Estos elementos contienen grmenes que pueden causar defectos congnitos en el beb.  Tome las vitaminas prenatales.  Tome entre 1500 y 2000mg de calcio diariamente comenzando en la semana20 del embarazo hasta el parto.  Si est estreida, pruebe un laxante suave (si el mdico lo autoriza). Consuma ms alimentos ricos en fibra, como vegetales y frutas frescos y cereales integrales. Beba gran cantidad de lquido para mantener la orina de tono claro o color amarillo plido.  Dese baos de asiento con agua tibia para aliviar el dolor o las molestias causadas por las hemorroides. Use una crema para las hemorroides si el mdico la autoriza.  Si tiene venas varicosas, use medias de descanso. Eleve los pies durante 15minutos, 3 o 4veces por da. Limite el consumo de sal en su dieta.  Evite levantar objetos pesados, use zapatos de tacones bajos y mantenga una buena postura.  Descanse con las piernas elevadas si tiene   calambres o dolor de cintura.  Visite a su dentista si no lo ha hecho durante el embarazo. Use un cepillo de dientes blando para higienizarse los dientes y psese el hilo dental con suavidad.  Puede seguir manteniendo relaciones sexuales, a menos que el mdico le indique lo contrario.  No haga viajes largos excepto que sea absolutamente necesario y solo con la autorizacin del mdico.  Tome clases prenatales para entender, practicar y hacer preguntas sobre el trabajo de parto y el parto.  Haga un ensayo de la partida al hospital.  Prepare el bolso que llevar al hospital.  Prepare la habitacin del beb.  Concurra a todas  las visitas prenatales segn las indicaciones de su mdico.  SOLICITE ATENCIN MDICA SI:  No est segura de que est en trabajo de parto o de que ha roto la bolsa de las aguas.  Tiene mareos.  Siente clicos leves, presin en la pelvis o dolor persistente en el abdomen.  Tiene nuseas, vmitos o diarrea persistentes.  Observa una secrecin vaginal con mal olor.  Siente dolor al orinar.  SOLICITE ATENCIN MDICA DE INMEDIATO SI:  Tiene fiebre.  Tiene una prdida de lquido por la vagina.  Tiene sangrado o pequeas prdidas vaginales.  Siente dolor intenso o clicos en el abdomen.  Sube o baja de peso rpidamente.  Tiene dificultad para respirar y siente dolor de pecho.  Sbitamente se le hinchan mucho el rostro, las manos, los tobillos, los pies o las piernas.  No ha sentido los movimientos del beb durante una hora.  Siente un dolor de cabeza intenso que no se alivia con medicamentos.  Su visin se modifica.  Esta informacin no tiene como fin reemplazar el consejo del mdico. Asegrese de hacerle al mdico cualquier pregunta que tenga. Document Released: 02/14/2005 Document Revised: 05/28/2014 Document Reviewed: 07/08/2012 Elsevier Interactive Patient Education  2017 Elsevier Inc.  

## 2016-11-01 ENCOUNTER — Other Ambulatory Visit (INDEPENDENT_AMBULATORY_CARE_PROVIDER_SITE_OTHER): Payer: Medicaid Other | Admitting: *Deleted

## 2016-11-01 DIAGNOSIS — O0993 Supervision of high risk pregnancy, unspecified, third trimester: Secondary | ICD-10-CM | POA: Diagnosis not present

## 2016-11-01 NOTE — Progress Notes (Signed)
Pt is here today for 29 wk labs.

## 2016-11-02 LAB — GLUCOSE TOLERANCE, 2 HOURS W/ 1HR
Glucose, 1 hour: 141 mg/dL (ref 65–179)
Glucose, 2 hour: 83 mg/dL (ref 65–152)
Glucose, Fasting: 82 mg/dL (ref 65–91)

## 2016-11-02 LAB — CBC
HEMATOCRIT: 32.9 % — AB (ref 34.0–46.6)
HEMOGLOBIN: 11.2 g/dL (ref 11.1–15.9)
MCH: 32 pg (ref 26.6–33.0)
MCHC: 34 g/dL (ref 31.5–35.7)
MCV: 94 fL (ref 79–97)
Platelets: 228 10*3/uL (ref 150–379)
RBC: 3.5 x10E6/uL — AB (ref 3.77–5.28)
RDW: 14.2 % (ref 12.3–15.4)
WBC: 7.7 10*3/uL (ref 3.4–10.8)

## 2016-11-02 LAB — RPR: RPR Ser Ql: NONREACTIVE

## 2016-11-02 LAB — HIV ANTIBODY (ROUTINE TESTING W REFLEX): HIV Screen 4th Generation wRfx: NONREACTIVE

## 2016-11-14 ENCOUNTER — Encounter: Payer: Self-pay | Admitting: Family Medicine

## 2016-11-23 ENCOUNTER — Ambulatory Visit (INDEPENDENT_AMBULATORY_CARE_PROVIDER_SITE_OTHER): Payer: Medicaid Other | Admitting: Obstetrics & Gynecology

## 2016-11-23 VITALS — BP 105/57 | HR 73 | Wt 158.0 lb

## 2016-11-23 DIAGNOSIS — O09212 Supervision of pregnancy with history of pre-term labor, second trimester: Secondary | ICD-10-CM | POA: Diagnosis not present

## 2016-11-23 DIAGNOSIS — Z98891 History of uterine scar from previous surgery: Secondary | ICD-10-CM

## 2016-11-23 DIAGNOSIS — O0992 Supervision of high risk pregnancy, unspecified, second trimester: Secondary | ICD-10-CM

## 2016-11-23 DIAGNOSIS — O09219 Supervision of pregnancy with history of pre-term labor, unspecified trimester: Secondary | ICD-10-CM

## 2016-11-23 DIAGNOSIS — O0990 Supervision of high risk pregnancy, unspecified, unspecified trimester: Secondary | ICD-10-CM

## 2016-11-23 NOTE — Progress Notes (Signed)
   PRENATAL VISIT NOTE  Subjective:  Anita [name] is a 26 y.o. [phone] at 7352w4d being seen today for ongoing prenatal care.  She is currently monitored for the following issues for this high-risk pregnancy and has History of incompetent cervix, currently pregnant; Previous preterm delivery, antepartum; Late prenatal care affecting pregnancy; Supervision of high risk pregnancy, antepartum; Previous cesarean section; and History of classical cesarean section on her problem list.  Patient reports no complaints.  Contractions: Not present. Vag. Bleeding: None.  Movement: Present. Denies leaking of fluid.   The following portions of the patient's history were reviewed and updated as appropriate: allergies, current medications, past family history, past medical history, past social history, past surgical history and problem list. Problem list updated.  Objective:   Vitals:   11/23/16 0904  BP: (!) 105/57  Pulse: 73  Weight: 158 lb (71.7 kg)    Fetal Status: Fetal Heart Rate (bpm): 159 Fundal Height: 31 cm Movement: Present     General:  Alert, oriented and cooperative. Patient is in no acute distress.  Skin: Skin is warm and dry. No rash noted.   Cardiovascular: Normal heart rate noted  Respiratory: Normal respiratory effort, no problems with respiration noted  Abdomen: Soft, gravid, appropriate for gestational age. Pain/Pressure: Present     Pelvic:  Cervical exam deferred        Extremities: Normal range of motion.  Edema: Trace  Mental Status: Normal mood and affect. Normal behavior. Normal judgment and thought content.   Assessment and Plan:  Pregnancy: [id number] at 2852w4d  1. History of classical cesarean section Already scheduled for RCS and BTS at 37 weeks. Making payment plans for BTS  2. Previous preterm delivery, antepartum Continue weekly 17P  3. Supervision of high risk pregnancy, antepartum Preterm labor symptoms and general obstetric precautions including but  not limited to vaginal bleeding, contractions, leaking of fluid and fetal movement were reviewed in detail with the patient. Please refer to After Visit Summary for other counseling recommendations.  Return in about 1 week (around 11/30/2016) for 17P only.  2 weeks: 17P and OB visit.   Jaynie CollinsUgonna Wassim Kirksey, MD

## 2016-11-23 NOTE — Patient Instructions (Signed)
Return to clinic for any scheduled appointments or obstetric concerns, or go to [facility] for evaluation  

## 2016-11-28 ENCOUNTER — Ambulatory Visit (INDEPENDENT_AMBULATORY_CARE_PROVIDER_SITE_OTHER): Payer: Medicaid Other | Admitting: *Deleted

## 2016-11-28 DIAGNOSIS — O09212 Supervision of pregnancy with history of pre-term labor, second trimester: Secondary | ICD-10-CM | POA: Diagnosis not present

## 2016-11-28 DIAGNOSIS — O09219 Supervision of pregnancy with history of pre-term labor, unspecified trimester: Secondary | ICD-10-CM

## 2016-12-07 ENCOUNTER — Ambulatory Visit (INDEPENDENT_AMBULATORY_CARE_PROVIDER_SITE_OTHER): Payer: Medicaid Other | Admitting: Obstetrics and Gynecology

## 2016-12-07 VITALS — BP 97/57 | HR 70 | Wt 162.0 lb

## 2016-12-07 DIAGNOSIS — O09299 Supervision of pregnancy with other poor reproductive or obstetric history, unspecified trimester: Secondary | ICD-10-CM

## 2016-12-07 DIAGNOSIS — O09213 Supervision of pregnancy with history of pre-term labor, third trimester: Secondary | ICD-10-CM | POA: Diagnosis not present

## 2016-12-07 DIAGNOSIS — O09219 Supervision of pregnancy with history of pre-term labor, unspecified trimester: Secondary | ICD-10-CM

## 2016-12-07 DIAGNOSIS — Z98891 History of uterine scar from previous surgery: Secondary | ICD-10-CM

## 2016-12-07 DIAGNOSIS — O09293 Supervision of pregnancy with other poor reproductive or obstetric history, third trimester: Secondary | ICD-10-CM

## 2016-12-07 DIAGNOSIS — O0990 Supervision of high risk pregnancy, unspecified, unspecified trimester: Secondary | ICD-10-CM

## 2016-12-07 NOTE — Progress Notes (Signed)
Prenatal Visit Note Date: 12/07/2016 Clinic: [facility]  Subjective:  Anita [name] is a 26 y.o. [phone] at 4540w4d being seen today for ongoing prenatal care.  She is currently monitored for the following issues for this high-risk pregnancy and has History of incompetent cervix, currently pregnant; Previous preterm delivery, antepartum; Late prenatal care affecting pregnancy; Supervision of high risk pregnancy, antepartum; Previous cesarean section; and History of classical cesarean section on her problem list.  Patient reports no complaints.   Contractions: Not present. Vag. Bleeding: None.  Movement: Present. Denies leaking of fluid.   The following portions of the patient's history were reviewed and updated as appropriate: allergies, current medications, past family history, past medical history, past social history, past surgical history and problem list. Problem list updated.  Objective:   Vitals:   12/07/16 1033  BP: (!) 97/57  Pulse: 70  Weight: 162 lb (73.5 kg)    Fetal Status: Fetal Heart Rate (bpm): 126 Fundal Height: 33 cm Movement: Present  Presentation: Vertex  General:  Alert, oriented and cooperative. Patient is in no acute distress.  Skin: Skin is warm and dry. No rash noted.   Cardiovascular: Normal heart rate noted  Respiratory: Normal respiratory effort, no problems with respiration noted  Abdomen: Soft, gravid, appropriate for gestational age. Pain/Pressure: Absent     Pelvic:  Cervical exam deferred        Extremities: Normal range of motion.  Edema: Mild pitting, slight indentation  Mental Status: Normal mood and affect. Normal behavior. Normal judgment and thought content.   Urinalysis:      Assessment and Plan:  Pregnancy: [id number] at 2340w4d  1. History of incompetent cervix, currently pregnant Late PNC.   2. Previous preterm delivery, antepartum Continue with qwk 17p  3. History of classical cesarean section Scheduled for  37/0. Desires BTL. Pt aware of likely will need to pay for it beforehand. Will try and clarify if needs to be paid first before doing it but she is 100% sure that she wants it done after d/w her re: r/b/a  4. Supervision of high risk pregnancy, antepartum gbs nv.   Preterm labor symptoms and general obstetric precautions including but not limited to vaginal bleeding, contractions, leaking of fluid and fetal movement were reviewed in detail with the patient. Please refer to After Visit Summary for other counseling recommendations.  Return in about 1 week (around 12/14/2016) for for 17p only visit. 2wk 17p and rob.   [place] BingPickens, Sherree Shankman, MD    BTS payment

## 2016-12-14 ENCOUNTER — Ambulatory Visit: Payer: Self-pay

## 2016-12-17 ENCOUNTER — Encounter (HOSPITAL_COMMUNITY): Payer: Self-pay

## 2016-12-17 NOTE — Pre-Procedure Instructions (Signed)
Interpreter number [id number] 

## 2016-12-21 ENCOUNTER — Encounter: Payer: Self-pay | Admitting: Family Medicine

## 2016-12-24 ENCOUNTER — Ambulatory Visit (INDEPENDENT_AMBULATORY_CARE_PROVIDER_SITE_OTHER): Payer: Medicaid Other | Admitting: Obstetrics and Gynecology

## 2016-12-24 VITALS — BP 102/66 | HR 79 | Wt 162.0 lb

## 2016-12-24 DIAGNOSIS — O09213 Supervision of pregnancy with history of pre-term labor, third trimester: Secondary | ICD-10-CM | POA: Diagnosis not present

## 2016-12-24 DIAGNOSIS — O09299 Supervision of pregnancy with other poor reproductive or obstetric history, unspecified trimester: Secondary | ICD-10-CM

## 2016-12-24 DIAGNOSIS — O0993 Supervision of high risk pregnancy, unspecified, third trimester: Secondary | ICD-10-CM

## 2016-12-24 DIAGNOSIS — Z113 Encounter for screening for infections with a predominantly sexual mode of transmission: Secondary | ICD-10-CM | POA: Diagnosis not present

## 2016-12-24 DIAGNOSIS — O09219 Supervision of pregnancy with history of pre-term labor, unspecified trimester: Secondary | ICD-10-CM

## 2016-12-24 DIAGNOSIS — Z98891 History of uterine scar from previous surgery: Secondary | ICD-10-CM

## 2016-12-24 DIAGNOSIS — O0990 Supervision of high risk pregnancy, unspecified, unspecified trimester: Secondary | ICD-10-CM

## 2016-12-24 NOTE — Progress Notes (Signed)
Prenatal Visit Note Date: 12/24/2016 Clinic: [facility]  Subjective:  Anita [name] is a 26 y.o. [phone] at 8942w0d being seen today for ongoing prenatal care.  She is currently monitored for the following issues for this high-risk pregnancy and has History of incompetent cervix, currently pregnant; Previous preterm delivery, antepartum; Late prenatal care affecting pregnancy; Supervision of high risk pregnancy, antepartum; Previous cesarean section; and History of classical cesarean section on her problem list.  Patient reports no complaints.   Contractions: Not present. Vag. Bleeding: None.  Movement: Present. Denies leaking of fluid.   The following portions of the patient's history were reviewed and updated as appropriate: allergies, current medications, past family history, past medical history, past social history, past surgical history and problem list. Problem list updated.  Objective:   Vitals:   12/24/16 1404  BP: 102/66  Pulse: 79  Weight: 162 lb (73.5 kg)    Fetal Status: Fetal Heart Rate (bpm): 128 Fundal Height: 36 cm Movement: Present  Presentation: Vertex  General:  Alert, oriented and cooperative. Patient is in no acute distress.  Skin: Skin is warm and dry. No rash noted.   Cardiovascular: Normal heart rate noted  Respiratory: Normal respiratory effort, no problems with respiration noted  Abdomen: Soft, gravid, appropriate for gestational age. Pain/Pressure: Present     Pelvic:  Cervical exam deferred        Extremities: Normal range of motion.  Edema: Trace  Mental Status: Normal mood and affect. Normal behavior. Normal judgment and thought content.   Urinalysis:      Assessment and Plan:  Pregnancy: [id number] at 2342w0d  1. Supervision of high risk pregnancy in third trimester Routine care. BTL paper signed - Culture, beta strep (group b only) - Cervicovaginal ancillary only  2. History of classical cesarean section Has rpt and BTL  1wk  3. Supervision of high risk pregnancy, antepartum   4. Previous preterm delivery, antepartum Last 17p today  Preterm labor symptoms and general obstetric precautions including but not limited to vaginal bleeding, contractions, leaking of fluid and fetal movement were reviewed in detail with the patient. Please refer to After Visit Summary for other counseling recommendations.  Return in about 5 weeks (around 01/28/2017) for PP visit.   [place] BingPickens, Jailani Hogans, MD

## 2016-12-25 LAB — CERVICOVAGINAL ANCILLARY ONLY
Chlamydia: NEGATIVE
Neisseria Gonorrhea: NEGATIVE

## 2016-12-28 ENCOUNTER — Encounter (HOSPITAL_COMMUNITY)
Admission: RE | Admit: 2016-12-28 | Discharge: 2016-12-28 | Disposition: A | Discharge status: Home / Self Care | Payer: Medicaid Other | Source: Ambulatory Visit | Attending: Obstetrics & Gynecology | Admitting: Obstetrics & Gynecology

## 2016-12-28 LAB — CBC
HEMATOCRIT: 36.8 % (ref 36.0–46.0)
Hemoglobin: 12.9 g/dL (ref 12.0–15.0)
MCH: 31.9 pg (ref 26.0–34.0)
MCHC: 35.1 g/dL (ref 30.0–36.0)
MCV: 90.9 fL (ref 78.0–100.0)
Platelets: 202 10*3/uL (ref 150–400)
RBC: 4.05 MIL/uL (ref 3.87–5.11)
RDW: 14.4 % (ref 11.5–15.5)
WBC: 8.2 10*3/uL (ref 4.0–10.5)

## 2016-12-28 LAB — TYPE AND SCREEN
ABO/RH(D): O POS
Antibody Screen: NEGATIVE

## 2016-12-28 LAB — CULTURE, BETA STREP (GROUP B ONLY): Strep Gp B Culture: NEGATIVE

## 2016-12-28 NOTE — Patient Instructions (Signed)
Instrucciones Para Antes de la Ciruga   Su ciruga est programada para 12/31/2016  (your procedure is scheduled on) Entre por la entrada principal [facility]  a las [address] de la [place] -(enter through the main entrance at [facility] at  [facility]    Levante el telfono, [place] el [phone] para informarnos de su llegada. (pick up phone, dial [phone] on arrival)     Por favor llame al [phone] si tiene algn problema en la maana de la ciruga (please call  if you have any problems the morning of surgery.)                  Recuerde: (Remember)  No coma alimentos despues a la media (Do not eat food )    No tome lquidos claros despues a la media (Do not drink clear liquids )    No use joyas, maquillaje de ojos, lpiz labial, crema para el cuerpo o esmalte de uas oscuro. (Do not wear jewelry, eye makeup, lipstick, body lotion, or dark fingernail polish). Puede usar desodorante (you may wear deodorant)    No se afeite 48 horas de su ciruga. (Do not shave 48 hours before your surgery)    No traiga objetos de valor al hospital.  [place] no se hace responsable de ninguna pertenencia, ni objetos de valor que haya trado al hospital. (Do not bring valuable to the hospital.  [place] is not responsible for any belongings or valuables brought to the hospital)   [facility] LLC[facility] estas medicinas en la maana de la ciruga con un SORBITO de agua nada (take these meds the morning of surgery with a SIP of water)     Durante la ciruga no se pueden usar lentes de contacto, dentaduras o puentes. (Contacts, dentures or bridgework cannot be worn in surgery).   Si va a ser ingresado despus de la ciruga, deje la AMR Corporationmaleta en el carro hasta que se le haya asignado una habitacin. (If you are to be admitted after surgery, leave suitcase in car until your room has been assigned.)   A los pacientes que se les d de alta el mismo da no se les permitir manejar a casa.   (Patients discharged on the day of surgery will not be allowed to drive home)    [place] y nmero de telfono del Engineer, productionconductor nada. (Name and telephone number of your driver)   Instrucciones especiales N/A (Special Instructions)   Por favor, lea las hojas informativas que le entregaron. (Please read over the following fact sheets that you were given) Surgical Site Infection Prevention

## 2016-12-29 LAB — RPR: RPR Ser Ql: NONREACTIVE

## 2016-12-31 ENCOUNTER — Inpatient Hospital Stay (HOSPITAL_COMMUNITY)
Admission: RE | Admit: 2016-12-31 | Discharge: 2017-01-03 | DRG: 765 | Disposition: A | Discharge status: Home / Self Care | Payer: Medicaid Other | Source: Ambulatory Visit | Attending: Obstetrics & Gynecology | Admitting: Obstetrics & Gynecology

## 2016-12-31 ENCOUNTER — Inpatient Hospital Stay (HOSPITAL_COMMUNITY): Payer: Medicaid Other | Admitting: Anesthesiology

## 2016-12-31 ENCOUNTER — Encounter (HOSPITAL_COMMUNITY)
Admission: RE | Disposition: A | Discharge status: Home / Self Care | Payer: Self-pay | Source: Ambulatory Visit | Attending: Obstetrics & Gynecology

## 2016-12-31 ENCOUNTER — Encounter (HOSPITAL_COMMUNITY): Payer: Self-pay | Admitting: Anesthesiology

## 2016-12-31 DIAGNOSIS — O34212 Maternal care for vertical scar from previous cesarean delivery: Secondary | ICD-10-CM | POA: Diagnosis not present

## 2016-12-31 DIAGNOSIS — O3433 Maternal care for cervical incompetence, third trimester: Secondary | ICD-10-CM | POA: Diagnosis present

## 2016-12-31 DIAGNOSIS — Z87891 Personal history of nicotine dependence: Secondary | ICD-10-CM

## 2016-12-31 DIAGNOSIS — Z3A37 37 weeks gestation of pregnancy: Secondary | ICD-10-CM

## 2016-12-31 DIAGNOSIS — Z302 Encounter for sterilization: Secondary | ICD-10-CM | POA: Diagnosis not present

## 2016-12-31 DIAGNOSIS — Z98891 History of uterine scar from previous surgery: Secondary | ICD-10-CM

## 2016-12-31 SURGERY — Surgical Case
Anesthesia: Spinal | Site: Abdomen | Laterality: Bilateral | Wound class: Clean Contaminated

## 2016-12-31 MED ORDER — MENTHOL 3 MG MT LOZG: 1.0000 | LOZENGE | OROMUCOSAL | Status: DC | PRN

## 2016-12-31 MED ORDER — SODIUM CHLORIDE 0.9% FLUSH: 3.0000 mL | INTRAVENOUS | Status: DC | PRN

## 2016-12-31 MED ORDER — FENTANYL CITRATE (PF) 100 MCG/2ML IJ SOLN: 25.0000 ug | INTRAMUSCULAR | Status: DC | PRN

## 2016-12-31 MED ORDER — DEXAMETHASONE SODIUM PHOSPHATE 4 MG/ML IJ SOLN
INTRAMUSCULAR | Status: DC | PRN
  Administered 2016-12-31: 4 mg via INTRAVENOUS

## 2016-12-31 MED ORDER — BUPIVACAINE HCL (PF) 0.5 % IJ SOLN
INTRAMUSCULAR | Status: DC | PRN
  Administered 2016-12-31: 30 mL

## 2016-12-31 MED ORDER — OXYCODONE HCL 5 MG PO TABS
5.0000 mg | ORAL_TABLET | ORAL | Status: DC | PRN
  Administered 2017-01-01 – 2017-01-02 (×5): 5 mg via ORAL
  Filled 2016-12-31 (×5): qty 1

## 2016-12-31 MED ORDER — SOD CITRATE-CITRIC ACID 500-334 MG/5ML PO SOLN
30.0000 mL | ORAL | Status: AC
  Administered 2016-12-31: 30 mL via ORAL
  Filled 2016-12-31: qty 15

## 2016-12-31 MED ORDER — KETOROLAC TROMETHAMINE 30 MG/ML IJ SOLN
INTRAMUSCULAR | Status: AC
  Filled 2016-12-31: qty 1

## 2016-12-31 MED ORDER — BUPIVACAINE HCL (PF) 0.5 % IJ SOLN
INTRAMUSCULAR | Status: AC
  Filled 2016-12-31: qty 30

## 2016-12-31 MED ORDER — COCONUT OIL OIL
1.0000 "application " | TOPICAL_OIL | Status: DC | PRN
  Administered 2017-01-03: 1 via TOPICAL
  Filled 2016-12-31: qty 120

## 2016-12-31 MED ORDER — PHENYLEPHRINE 8 MG IN D5W 100 ML (0.08MG/ML) PREMIX OPTIME
INJECTION | INTRAVENOUS | Status: AC
  Filled 2016-12-31: qty 100

## 2016-12-31 MED ORDER — MEPERIDINE HCL 25 MG/ML IJ SOLN: 6.2500 mg | INTRAMUSCULAR | Status: DC | PRN

## 2016-12-31 MED ORDER — LACTATED RINGERS IV SOLN: INTRAVENOUS | Status: DC

## 2016-12-31 MED ORDER — LACTATED RINGERS IV SOLN
INTRAVENOUS | Status: DC
  Administered 2016-12-31: 12:00:00 via INTRAVENOUS

## 2016-12-31 MED ORDER — KETOROLAC TROMETHAMINE 30 MG/ML IJ SOLN
30.0000 mg | Freq: Four times a day (QID) | INTRAMUSCULAR | Status: DC | PRN
  Administered 2016-12-31: 30 mg via INTRAMUSCULAR

## 2016-12-31 MED ORDER — IBUPROFEN 600 MG PO TABS
600.0000 mg | ORAL_TABLET | Freq: Four times a day (QID) | ORAL | Status: DC
  Administered 2016-12-31 – 2017-01-03 (×12): 600 mg via ORAL
  Filled 2016-12-31 (×12): qty 1

## 2016-12-31 MED ORDER — PHENYLEPHRINE 8 MG IN D5W 100 ML (0.08MG/ML) PREMIX OPTIME
INJECTION | INTRAVENOUS | Status: DC | PRN
  Administered 2016-12-31: 60 ug/min via INTRAVENOUS

## 2016-12-31 MED ORDER — ONDANSETRON HCL 4 MG/2ML IJ SOLN
INTRAMUSCULAR | Status: AC
  Filled 2016-12-31: qty 2

## 2016-12-31 MED ORDER — NALBUPHINE HCL 10 MG/ML IJ SOLN: 5.0000 mg | Freq: Once | INTRAMUSCULAR | Status: DC | PRN

## 2016-12-31 MED ORDER — SIMETHICONE 80 MG PO CHEW
80.0000 mg | CHEWABLE_TABLET | ORAL | Status: DC
  Administered 2016-12-31 – 2017-01-02 (×3): 80 mg via ORAL
  Filled 2016-12-31 (×3): qty 1

## 2016-12-31 MED ORDER — NALBUPHINE HCL 10 MG/ML IJ SOLN: 5.0000 mg | INTRAMUSCULAR | Status: DC | PRN

## 2016-12-31 MED ORDER — KETOROLAC TROMETHAMINE 30 MG/ML IJ SOLN: 30.0000 mg | Freq: Four times a day (QID) | INTRAMUSCULAR | Status: DC | PRN

## 2016-12-31 MED ORDER — PROMETHAZINE HCL 25 MG/ML IJ SOLN: 6.2500 mg | INTRAMUSCULAR | Status: DC | PRN

## 2016-12-31 MED ORDER — FENTANYL CITRATE (PF) 100 MCG/2ML IJ SOLN
INTRAMUSCULAR | Status: AC
  Filled 2016-12-31: qty 2

## 2016-12-31 MED ORDER — OXYTOCIN 40 UNITS IN LACTATED RINGERS INFUSION - SIMPLE MED: 2.5000 [IU]/h | INTRAVENOUS | Status: AC

## 2016-12-31 MED ORDER — NALBUPHINE HCL 10 MG/ML IJ SOLN
INTRAMUSCULAR | Status: AC
  Filled 2016-12-31: qty 1

## 2016-12-31 MED ORDER — LACTATED RINGERS IV SOLN
INTRAVENOUS | Status: DC | PRN
  Administered 2016-12-31: 10:00:00 via INTRAVENOUS

## 2016-12-31 MED ORDER — SENNOSIDES-DOCUSATE SODIUM 8.6-50 MG PO TABS
2.0000 | ORAL_TABLET | ORAL | Status: DC
  Administered 2016-12-31 – 2017-01-02 (×3): 2 via ORAL
  Filled 2016-12-31 (×3): qty 2

## 2016-12-31 MED ORDER — ONDANSETRON HCL 4 MG/2ML IJ SOLN
INTRAMUSCULAR | Status: DC | PRN
  Administered 2016-12-31: 4 mg via INTRAVENOUS

## 2016-12-31 MED ORDER — SIMETHICONE 80 MG PO CHEW
80.0000 mg | CHEWABLE_TABLET | Freq: Three times a day (TID) | ORAL | Status: DC
  Administered 2016-12-31 – 2017-01-03 (×7): 80 mg via ORAL
  Filled 2016-12-31 (×8): qty 1

## 2016-12-31 MED ORDER — DIPHENHYDRAMINE HCL 25 MG PO CAPS: 25.0000 mg | ORAL_CAPSULE | ORAL | Status: DC | PRN

## 2016-12-31 MED ORDER — NALOXONE HCL 0.4 MG/ML IJ SOLN: 0.4000 mg | INTRAMUSCULAR | Status: DC | PRN

## 2016-12-31 MED ORDER — NALBUPHINE HCL 10 MG/ML IJ SOLN
5.0000 mg | INTRAMUSCULAR | Status: DC | PRN
  Administered 2016-12-31: 5 mg via SUBCUTANEOUS

## 2016-12-31 MED ORDER — DIBUCAINE 1 % RE OINT: 1.0000 "application " | TOPICAL_OINTMENT | RECTAL | Status: DC | PRN

## 2016-12-31 MED ORDER — OXYTOCIN 10 UNIT/ML IJ SOLN
INTRAMUSCULAR | Status: AC
  Filled 2016-12-31: qty 4

## 2016-12-31 MED ORDER — ACETAMINOPHEN 325 MG PO TABS
650.0000 mg | ORAL_TABLET | ORAL | Status: DC | PRN
  Administered 2016-12-31 – 2017-01-03 (×7): 650 mg via ORAL
  Filled 2016-12-31 (×7): qty 2

## 2016-12-31 MED ORDER — SCOPOLAMINE 1 MG/3DAYS TD PT72
1.0000 | MEDICATED_PATCH | Freq: Once | TRANSDERMAL | Status: DC
  Administered 2016-12-31: 1.5 mg via TRANSDERMAL

## 2016-12-31 MED ORDER — DIPHENHYDRAMINE HCL 50 MG/ML IJ SOLN
12.5000 mg | INTRAMUSCULAR | Status: DC | PRN
Start: 2016-12-31 — End: 2017-01-03

## 2016-12-31 MED ORDER — WITCH HAZEL-GLYCERIN EX PADS: 1.0000 "application " | MEDICATED_PAD | CUTANEOUS | Status: DC | PRN

## 2016-12-31 MED ORDER — PRENATAL MULTIVITAMIN CH
1.0000 | ORAL_TABLET | Freq: Every day | ORAL | Status: DC
  Administered 2017-01-01 – 2017-01-03 (×3): 1 via ORAL
  Filled 2016-12-31 (×3): qty 1

## 2016-12-31 MED ORDER — MORPHINE SULFATE (PF) 0.5 MG/ML IJ SOLN
INTRAMUSCULAR | Status: AC
  Filled 2016-12-31: qty 10

## 2016-12-31 MED ORDER — TETANUS-DIPHTH-ACELL PERTUSSIS 5-2.5-18.5 LF-MCG/0.5 IM SUSP: 0.5000 mL | Freq: Once | INTRAMUSCULAR | Status: DC

## 2016-12-31 MED ORDER — OXYTOCIN 10 UNIT/ML IJ SOLN
INTRAVENOUS | Status: DC | PRN
  Administered 2016-12-31: 40 [IU] via INTRAVENOUS

## 2016-12-31 MED ORDER — NALBUPHINE HCL 10 MG/ML IJ SOLN
5.0000 mg | Freq: Once | INTRAMUSCULAR | Status: DC | PRN
Start: 2016-12-31 — End: 2017-01-03

## 2016-12-31 MED ORDER — NALOXONE HCL 2 MG/2ML IJ SOSY
1.0000 ug/kg/h | PREFILLED_SYRINGE | INTRAVENOUS | Status: DC | PRN
  Filled 2016-12-31: qty 2

## 2016-12-31 MED ORDER — OXYCODONE HCL 5 MG PO TABS: 10.0000 mg | ORAL_TABLET | ORAL | Status: DC | PRN

## 2016-12-31 MED ORDER — LACTATED RINGERS IV SOLN
INTRAVENOUS | Status: DC
  Administered 2016-12-31 (×2): via INTRAVENOUS

## 2016-12-31 MED ORDER — DIPHENHYDRAMINE HCL 25 MG PO CAPS: 25.0000 mg | ORAL_CAPSULE | Freq: Four times a day (QID) | ORAL | Status: DC | PRN

## 2016-12-31 MED ORDER — CEFAZOLIN SODIUM-DEXTROSE 2-4 GM/100ML-% IV SOLN
2.0000 g | INTRAVENOUS | Status: AC
  Administered 2016-12-31: 2 g via INTRAVENOUS
  Filled 2016-12-31: qty 100

## 2016-12-31 MED ORDER — SIMETHICONE 80 MG PO CHEW: 80.0000 mg | CHEWABLE_TABLET | ORAL | Status: DC | PRN

## 2016-12-31 MED ORDER — ONDANSETRON HCL 4 MG/2ML IJ SOLN: 4.0000 mg | Freq: Three times a day (TID) | INTRAMUSCULAR | Status: DC | PRN

## 2016-12-31 MED ORDER — ZOLPIDEM TARTRATE 5 MG PO TABS: 5.0000 mg | ORAL_TABLET | Freq: Every evening | ORAL | Status: DC | PRN

## 2016-12-31 MED ORDER — SCOPOLAMINE 1 MG/3DAYS TD PT72
MEDICATED_PATCH | TRANSDERMAL | Status: AC
  Filled 2016-12-31: qty 1

## 2016-12-31 MED ORDER — BUPIVACAINE IN DEXTROSE 0.75-8.25 % IT SOLN
INTRATHECAL | Status: AC
  Filled 2016-12-31: qty 2

## 2016-12-31 SURGICAL SUPPLY — 39 items
BENZOIN TINCTURE PRP APPL 2/3 (GAUZE/BANDAGES/DRESSINGS) ×3 IMPLANT
BINDER ABD UNIV 10 28-50 (GAUZE/BANDAGES/DRESSINGS) IMPLANT
BINDER ABD UNIV 12 45-62 (WOUND CARE) IMPLANT
BINDER ABDOM UNIV 10 (GAUZE/BANDAGES/DRESSINGS)
BINDER ABDOMINAL 46IN 62IN (WOUND CARE)
CLAMP CORD UMBIL (MISCELLANEOUS) IMPLANT
CLIP FILSHIE TUBAL LIGA STRL (Clip) ×3 IMPLANT
CLOSURE WOUND 1/2 X4 (GAUZE/BANDAGES/DRESSINGS) ×1
CLOTH BEACON ORANGE TIMEOUT ST (SAFETY) ×3 IMPLANT
DRSG OPSITE POSTOP 4X10 (GAUZE/BANDAGES/DRESSINGS) ×3 IMPLANT
DURAPREP 26ML APPLICATOR (WOUND CARE) ×3 IMPLANT
ELECT REM PT RETURN 9FT ADLT (ELECTROSURGICAL) ×3
ELECTRODE REM PT RTRN 9FT ADLT (ELECTROSURGICAL) ×1 IMPLANT
EXTRACTOR VACUUM KIWI (MISCELLANEOUS) IMPLANT
GLOVE BIO SURGEON STRL SZ7 (GLOVE) ×3 IMPLANT
GLOVE BIOGEL PI IND STRL 7.0 (GLOVE) ×2 IMPLANT
GLOVE BIOGEL PI INDICATOR 7.0 (GLOVE) ×4
GOWN STRL REUS W/TWL LRG LVL3 (GOWN DISPOSABLE) ×6 IMPLANT
GOWN STRL REUS W/TWL XL LVL3 (GOWN DISPOSABLE) ×3 IMPLANT
KIT ABG SYR 3ML LUER SLIP (SYRINGE) IMPLANT
NEEDLE HYPO 22GX1.5 SAFETY (NEEDLE) ×3 IMPLANT
NEEDLE HYPO 25X5/8 SAFETYGLIDE (NEEDLE) IMPLANT
NS IRRIG 1000ML POUR BTL (IV SOLUTION) ×3 IMPLANT
PACK C SECTION WH (CUSTOM PROCEDURE TRAY) ×3 IMPLANT
PAD OB MATERNITY 4.3X12.25 (PERSONAL CARE ITEMS) ×3 IMPLANT
PENCIL SMOKE EVAC W/HOLSTER (ELECTROSURGICAL) ×3 IMPLANT
RTRCTR C-SECT PINK 25CM LRG (MISCELLANEOUS) IMPLANT
SPONGE SURGIFOAM ABS GEL 12-7 (HEMOSTASIS) IMPLANT
STRIP CLOSURE SKIN 1/2X4 (GAUZE/BANDAGES/DRESSINGS) ×2 IMPLANT
SUT PDS AB 0 CTX 60 (SUTURE) IMPLANT
SUT PLAIN 0 NONE (SUTURE) IMPLANT
SUT SILK 0 TIES 10X30 (SUTURE) IMPLANT
SUT VIC AB 0 CT1 36 (SUTURE) ×9 IMPLANT
SUT VIC AB 3-0 CT1 27 (SUTURE) ×2
SUT VIC AB 3-0 CT1 TAPERPNT 27 (SUTURE) ×1 IMPLANT
SUT VIC AB 4-0 KS 27 (SUTURE) IMPLANT
SYR CONTROL 10ML LL (SYRINGE) ×3 IMPLANT
TOWEL OR 17X24 6PK STRL BLUE (TOWEL DISPOSABLE) ×3 IMPLANT
TRAY FOLEY BAG SILVER LF 14FR (SET/KITS/TRAYS/PACK) ×3 IMPLANT

## 2016-12-31 NOTE — Progress Notes (Signed)
Dr. Hart RochesterHollis anesthesia, and Dr. Erin FullingHarraway-Smith attending were called to notify them of patients low pulse rate in the high 40s and low 50s. All assessments were with in normal limits. No apparent signs of distress. Dr. Erin FullingHarraway-Smith stated to call if patient's condition deteriorates. Will continue close observation.

## 2016-12-31 NOTE — Lactation Note (Signed)
This note was copied from a baby's chart. Lactation Consultation Note  Patient Name: Anita [name] AVWUJ'WToday's Date: 12/31/2016 Reason for consult: Initial assessment;Early term 37-38.6wks   Initial consult with Exp BF mom of 6 hour old early term infant. Mom was very sleepy and had a hard time keeping her eyes open during consult. Her mom and sister were in the room assisting mom. Mom reports infant has latched and fed for 14 minutes and then supplemented with 10 cc formula. She plans to breast and bottle feed. Mom reports she does not have enough milk now and that she did not have enough milk for her daughter. She supplemented her daughter from the beginning. Discussed supply and demand and milk coming to volume. Reviewed colostrum and hand expression. Mom reports she is aware of how to hand express.   Enc mom to feed infant STS at least 8-12 x in 24 hours at first feeding cues. Enc mom to hand express before each feeding. Mom reports she will hand express if it will help. Enc her to do so. Discussed setting up DEBP later tonight to begin pumping to supplement infant, mom currently too sleepy for teaching pump set up. Cain SaupeMartha Stringer, RN notified of need for pump when mom more alert. LC phone # left for mom to call for next feeding.   BF Resources Handout and LC Brochure given, mom informed of IP/OP Services, BF Support Groups and LC phone #. [facility] she does not have a pump at home. Enc mom to call out for next feeding.        Maternal Data Formula Feeding for Exclusion: Yes Reason for exclusion: Mother's choice to formula and breast feed on admission Has patient been taught Hand Expression?: Yes Does the patient have breastfeeding experience prior to this delivery?: Yes  Feeding Feeding Type: Formula  LATCH Score                   Interventions    Lactation Tools Discussed/Used WIC Program: No (Plans to apply)   Consult  Status Consult Status: Follow-up Date: 12/31/16 Follow-up type: In-patient    Silas FloodSharon S Padraig Nhan 12/31/2016, 4:40 PM

## 2016-12-31 NOTE — Transfer of Care (Signed)
Immediate Anesthesia Transfer of Care Note  Patient: Anita [name]  Procedure(s) Performed: Procedure(s): REPEAT CESAREAN SECTION (Bilateral)  Patient Location: PACU  Anesthesia Type:Spinal  Level of Consciousness: awake, alert  and oriented  Airway & Oxygen Therapy: Patient Spontanous Breathing  Post-op Assessment: Report given to RN and Post -op Vital signs reviewed and stable  Post vital signs: Reviewed and stable  Last Vitals:  Vitals:   12/31/16 0740  BP: 105/61  Pulse: 68  Resp: 18  Temp: 36.7 C    Last Pain:  Vitals:   12/31/16 0740  TempSrc: Oral         Complications: No apparent anesthesia complications

## 2016-12-31 NOTE — H&P (Signed)
Obstetric Preoperative History and Physical  Anita [name] is a 26 y.o. [phone] with IUP at 2632w0d by 18-week U/S, and pregnancy complicated by hx of classical cesarean section, previous PTD, and [facility].  She would also like BTL. No acute concerns.   Prenatal Course [facility] [place] [place]  Pregnancy complications or risks: Patient Active Problem List   Diagnosis Date Noted  . Supervision of high risk pregnancy, antepartum 08/30/2016  . Previous cesarean section 08/30/2016  . History of classical cesarean section 08/30/2016  . History of incompetent cervix, currently pregnant 08/23/2016  . Previous preterm delivery, antepartum 08/23/2016  . Late prenatal care affecting pregnancy 08/23/2016   She breast and bottle feed She desires bilateral tubal ligation for postpartum contraception.   Prenatal labs and studies: ABO, Rh: --/--/O POS (08/10 1045) Antibody: NEG (08/10 1045) Rubella: 9.28 (04/12 1005) RPR: Non Reactive (08/10 1045)  HBsAg: Negative (04/12 1005)  HIV:   Negative GBS: negative Glucola: negative Genetic screening not done Anatomy US normal  Prenatal Transfer Tool  Maternal Diabetes: No Genetic Screening: not done Maternal Ultrasounds/Referrals: Normal Fetal Ultrasounds or other Referrals:  None Maternal Substance Abuse:  No Significant Maternal Medications:  17-P Significant Maternal Lab Results: Lab values include: Group B Strep negative  Past Medical History:  Diagnosis Date  . Depression    takes no meds    Past Surgical History:  Procedure Laterality Date  . CERVICAL CERCLAGE  07/03/2011   Procedure: CERCLAGE CERVICAL;  Surgeon: Kathreen CosierBernard A Marshall, MD;  Location: [facility];  Service: Gynecology;  Laterality: N/A;  . CESAREAN SECTION  2010   73month fetal demise..   . CESAREAN SECTION  12/04/2011   Procedure: CESAREAN SECTION;  Surgeon: Kathreen CosierBernard A Marshall, MD;  Location: [facility];  Service:  Gynecology;  Laterality: N/A;    OB History  Gravida Para Term Preterm AB Living  4 2 0 2 1 1   SAB TAB Ectopic Multiple Live Births  1       2    # Outcome Date GA Lbr Len/2nd Weight Sex Delivery Anes PTL Lv  4 Current           3 Preterm 12/04/11 4928w1d  6 lb (2.722 kg) F CS-LTranv Spinal  LIV  2 Preterm  3331w0d    CS-Unspec  Y ND  1 SAB             Obstetric Comments  1st pregnancy, water broke, hospitalized, abx, then bleeding and C/S  2nd pregnancy, loss at 4 weeks  3rd pregnancy, Cerclage due to previous losses then repeat C/S    Social History   Social History  . Marital status: Single    Spouse name: N/A  . Number of children: N/A  . Years of education: N/A   Social History Main Topics  . Smoking status: Former Smoker    Types: Cigarettes    Quit date: 07/02/2010  . Smokeless tobacco: Never Used  . Alcohol use No  . Drug use: No  . Sexual activity: Not Currently    Birth control/ protection: None   Other Topics Concern  . None   Social History Narrative  . None    History reviewed. No pertinent family history.  Facility-Administered Medications Prior to Admission  Medication Dose Route Frequency Provider Last Rate Last Dose  . hydroxyprogesterone caproate (MAKENA) 250 mg/mL injection 250 mg  250 mg Intramuscular Weekly Reva BoresPratt, Tanya S, MD   250 mg at 12/07/16  1040   Prescriptions Prior to Admission  Medication Sig Dispense Refill Last Dose  . Prenatal Vit-Fe Fumarate-FA (PRENATAL MULTIVITAMIN) TABS tablet Take 1 tablet by mouth daily at 12 noon.   Taking    No Known Allergies  Review of Systems: Negative except for what is mentioned in HPI.  Physical Exam: BP 105/61   Pulse 68   Temp 98.1 F (36.7 C) (Oral)   Resp 18   Ht 5\' 2"  (1.575 m)   Wt 164 lb (74.4 kg)   LMP  (LMP Unknown) Comment: irregular periods, doesn't know LMP  BMI 30.00 kg/m   CONSTITUTIONAL: Well-developed, well-nourished female in no acute distress.  HENT:  Normocephalic,  atraumatic.Oropharynx is clear and moist EYES: Conjunctivae and EOM are normal. Pupils are equal, round, and reactive to light. No scleral icterus.  NECK: Normal range of motion, supple, no masses SKIN: Skin is warm and dry. No rash noted. Not diaphoretic. No erythema. No pallor. NEUROLGIC: Alert and oriented to person, place, and time. No cranial nerve deficit noted. PSYCHIATRIC: Normal mood and affect. Normal behavior. Normal judgment and thought content. CARDIOVASCULAR: Normal heart rate noted, regular rhythm. Peripheral pulses 2+ RESPIRATORY: Effort and breath sounds normal, no problems with respiration noted ABDOMEN: Soft, nontender, nondistended, gravid. Well-healed incision. PELVIC: Deferred MUSCULOSKELETAL: Normal range of motion. No edema and no tenderness. 2+ distal pulses.   Pertinent Labs/Studies:   Results for orders placed or performed during the hospital encounter of 12/28/16 (from the past 72 hour(s))  CBC     Status: None   Collection Time: 12/28/16 10:45 AM  Result Value Ref Range   WBC 8.2 4.0 - 10.5 K/uL   RBC 4.05 3.87 - 5.11 MIL/uL   Hemoglobin 12.9 12.0 - 15.0 g/dL   HCT 16.1 09.6 - 04.5 %   MCV 90.9 78.0 - 100.0 fL   MCH 31.9 26.0 - 34.0 pg   MCHC 35.1 30.0 - 36.0 g/dL   RDW 40.9 81.1 - 91.4 %   Platelets 202 150 - 400 K/uL  Type and screen [facility]     Status: None   Collection Time: 12/28/16 10:45 AM  Result Value Ref Range   ABO/RH(D) O POS    Antibody Screen NEG    Sample Expiration 12/31/2016   RPR     Status: None   Collection Time: 12/28/16 10:45 AM  Result Value Ref Range   RPR Ser Ql Non Reactive Non Reactive    Comment: (NOTE) Performed At: [facility] [address] [place], [place] [id number] Mila Homer MD YQ:[id number]     Assessment and Plan :Rudine Rieger is a 26 y.o. [id number] at [redacted]w[redacted]d being admitted for scheduled cesarean section. The risks of cesarean section discussed with the patient  included but were not limited to: bleeding which may require transfusion or reoperation; infection which may require antibiotics; injury to bowel, bladder, ureters or other surrounding organs; injury to the fetus; need for additional procedures including hysterectomy in the event of a life-threatening hemorrhage; placental abnormalities wth subsequent pregnancies, incisional problems, thromboembolic phenomenon and other postoperative/anesthesia complications. The patient concurred with the proposed plan, giving informed written consent for the procedure. Patient has been NPO since last night she will remain NPO for procedure. Anesthesia and OR aware. Preoperative prophylactic antibiotics and SCDs ordered on call to the OR. To OR when ready.   Raynelle Fanning P. Degele, MD OB Fellow Faculty Practice, [facility]

## 2016-12-31 NOTE — Op Note (Signed)
Operative Report  Anita [name]  PROCEDURE DATE: 12/31/2016  PREOPERATIVE DIAGNOSES: Intrauterine pregnancy at [redacted]w[redacted]d weeks gestation; History of previous Cesarean Section; History of classical uterine incision;  Undesired fertility  POSTOPERATIVE DIAGNOSES: The same  PROCEDURE: Repeat Low Transverse Cesarean Section; Bilateral Tubal Sterilization using Filshie clips  SURGEON:   Surgeon(s) and Role:    * Willodean Rosenthal, MD - Primary - Attending    * Nolene Ebbs, MD - OB Fellow  ASSISTANT:  Nolene Ebbs, MD - OB Fellow   INDICATIONS: Anita [name] is a 26 y.o. [phone] at [redacted]w[redacted]d here for cesarean section secondary to the indications listed under preoperative diagnoses; please see preoperative note for further details.  The risks of cesarean section were discussed with the patient including but were not limited to: bleeding which may require transfusion or reoperation; infection which may require antibiotics; injury to bowel, bladder, ureters or other surrounding organs; injury to the fetus; need for additional procedures including hysterectomy in the event of a life-threatening hemorrhage; placental abnormalities wth subsequent pregnancies, incisional problems, thromboembolic phenomenon and other postoperative/anesthesia complications.   The patient concurred with the proposed plan, giving informed written consent for the procedure.    FINDINGS:  Viable female infant in cephalic presentation.  Apgars 8 and 9.  Clear amniotic fluid.  Intact placenta, three vessel cord.  Normal uterus, fallopian tubes and ovaries bilaterally.  ANESTHESIA: Spinal INTRAVENOUS FLUIDS: 2300 ml ESTIMATED BLOOD LOSS: 535 ml URINE OUTPUT:  150 ml SPECIMENS: Placenta sent to L&D COMPLICATIONS: None immediate  PROCEDURE IN DETAIL:  The patient preoperatively received intravenous antibiotics and had sequential compression devices applied to her lower extremities.  She was then taken to the  operating room where spinal anesthesia was administered and was found to be adequate. She was then placed in a dorsal supine position with a leftward tilt, and prepped and draped in a sterile manner.  A foley catheter was placed into her bladder and attached to constant gravity.    After an adequate timeout was performed, a Pfannenstiel skin incision was made with scalpel and carried through to the underlying layer of fascia. The fascia was incised in the midline, and this incision was extended bilaterally using the Mayo scissors.  Kocher clamps were applied to the superior aspect of the fascial incision and the underlying rectus muscles were dissected off bluntly.  A similar process was carried out on the inferior aspect of the fascial incision. The rectus muscles were separated in the midline bluntly and the peritoneum was entered bluntly. A bladder flap was created. Attention was turned to the lower uterine segment where a low transverse hysterotomy was made with a scalpel and extended bilaterally bluntly.  The infant was successfully delivered, the cord was clamped and cut after one minute, and the infant was handed over to the awaiting neonatology team. Uterine massage was then administered, and the placenta delivered intact with a three-vessel cord. The uterus was then cleared of clots and debris.  The hysterotomy was closed with 0 Vicryl in a running locked fashion. Attention was then turned to the fallopian tubes, and Filshie clips were placed about 3 cm from the cornua, with care given to incorporate the underlying mesosalpinx on both sides, allowing for bilateral tubal sterilization. The pelvis was cleared of all clot and debris. Hemostasis was confirmed on all surfaces.  The peritoneum and rectus muscles were approximated with a 0 Vicryl stitch. The fascia was then closed using 0 Vicryl in a running fashion.  The subcutaneous layer  was irrigated, and 30 ml of 0.5% Marcaine was injected subcutaneously  around the incision.  The skin was closed with a 4-0 Vicryl subcuticular stitch.   The patient tolerated the procedure well. Sponge, lap, instrument and needle counts were correct x 3.  She was taken to the recovery room in stable condition.     Disposition: PACU - hemodynamically stable.   Maternal Condition: stable    Signed: Frederik PearJulie P Degele, MD OB Fellow 12/31/2016 10:34 AM

## 2016-12-31 NOTE — Anesthesia Preprocedure Evaluation (Addendum)
Anesthesia Evaluation  Patient identified by MRN, date of birth, ID band Patient awake    Reviewed: Allergy & Precautions, NPO status , Patient's Chart, lab work & pertinent test results  Airway Mallampati: II  TM Distance: >3 FB Neck ROM: Full    Dental no notable dental hx.    Pulmonary neg pulmonary ROS, former smoker,    Pulmonary exam normal        Cardiovascular negative cardio ROS Normal cardiovascular exam     Neuro/Psych PSYCHIATRIC DISORDERS Depression negative neurological ROS     GI/Hepatic negative GI ROS, Neg liver ROS,   Endo/Other  negative endocrine ROS  Renal/GU negative Renal ROS     Musculoskeletal negative musculoskeletal ROS (+)   Abdominal   Peds  Hematology negative hematology ROS (+)   Anesthesia Other Findings Day of surgery medications reviewed with the patient.  Reproductive/Obstetrics (+) Pregnancy                           Lab Results  Component Value Date   WBC 8.2 12/28/2016   HGB 12.9 12/28/2016   HCT 36.8 12/28/2016   MCV 90.9 12/28/2016   PLT 202 12/28/2016     Anesthesia Physical Anesthesia Plan  ASA: II  Anesthesia Plan: Spinal   Post-op Pain Management:    Induction:   PONV Risk Score and Plan: 0 and Ondansetron  Airway Management Planned: Natural Airway  Additional Equipment:   Intra-op Plan:   Post-operative Plan:   Informed Consent: I have reviewed the patients History and Physical, chart, labs and discussed the procedure including the risks, benefits and alternatives for the proposed anesthesia with the patient or authorized representative who has indicated his/her understanding and acceptance.     Plan Discussed with: CRNA  Anesthesia Plan Comments:         Anesthesia Quick Evaluation

## 2016-12-31 NOTE — Anesthesia Postprocedure Evaluation (Signed)
Anesthesia Post Note  Patient: Anita [name]  Procedure(s) Performed: Procedure(s) (LRB): REPEAT CESAREAN SECTION (Bilateral)     Patient location during evaluation: PACU Anesthesia Type: Spinal Level of consciousness: oriented and awake and alert Pain management: pain level controlled Vital Signs Assessment: post-procedure vital signs reviewed and stable Respiratory status: spontaneous breathing, respiratory function stable and patient connected to nasal cannula oxygen Cardiovascular status: blood pressure returned to baseline and stable Postop Assessment: no headache, no backache, spinal receding and patient able to bend at knees Anesthetic complications: no    Last Vitals:  Vitals:   12/31/16 1130 12/31/16 1145  BP: (!) 97/55 98/70  Pulse: (!) 54 (!) 58  Resp: 18 19  Temp:    SpO2: 100% 100%    Last Pain:  Vitals:   12/31/16 1035  TempSrc: Oral   Pain Goal:                 Shelton SilvasKevin D Christos Mixson

## 2016-12-31 NOTE — Lactation Note (Signed)
This note was copied from a baby's chart. Lactation Consultation Note P2 mom called out for LC to assist with feed.  Infant cueing in bassinet.  Infant unwrapped and placed STS.   Mom was able to demonstrate hand exp. Glistening noted at nipple.  Mom attempted to latch infant in cradle hold.  Infant needed more head and neck support.  Mom tried cross cradle to latch then moved to cradle.  Infant latched with wide gape after several attempts; once latched, rhythmic jaw movement noted and swallows heard with constant massage and compression. Infant feed continuously with stimulation for 12 minutes.   She states she feels a pinch not just a tug; infant suction broken and infant repositioned.  Mom's nipple was everted with a short shaft and slightly compressed.  Mom denies discomfort just a pinch feeling.  LC explains that a tug is the desired feeling.  LC hand expressed 1 ml into spoon on alt. Breast while infant fed and colostrum was fed back to infant via finger feed.  Infant rooting but LC noticed infant was sucking his tongue.  LC encouraged infant to bring tongue out under finger by suck training with expressed colostrum.  LC explained this to mom.  Infant latched again to feed but shortly fell asleep afterwards.  Mom placed infant on chest and niece was in room to help mom to eat meal that was just delivered.  LC encouraged mom to practice hand expressing in spoon and to call out if she was able to express any and we could help her feed it back to baby.  Mom states she would be interested in the DEBP later in order to encourage her milk supply. LC discussed pump set up with RN later in evening if mom wishes and when mom is feeling more energy for instructions.    Patient Name: Anita [name] ZOXWR'UToday's Date: 12/31/2016 Reason for consult: Initial assessment   Maternal Data Formula Feeding for Exclusion: Yes Reason for exclusion: Mother's choice to formula and breast feed on admission Has  patient been taught Hand Expression?: Yes (mom able to demonstrate/ glistening drops noted first; during feed 1 cc collected in spoon and finger fed back to infant) Does the patient have breastfeeding experience prior to this delivery?: Yes  Feeding Feeding Type: Breast Fed Length of feed: 12 min  LATCH Score Latch: Repeated attempts needed to sustain latch, nipple held in mouth throughout feeding, stimulation needed to elicit sucking reflex.  Audible Swallowing: A few with stimulation  Type of Nipple: Everted at rest and after stimulation (shorter shaft/everts well after feeding)  Comfort (Breast/Nipple): Soft / non-tender  Hold (Positioning): Assistance needed to correctly position infant at breast and maintain latch. (mom wants to cradle hold while feeding/enc. pillows/blankets to help support mom arms )  LATCH Score: 7  Interventions Interventions: Breast feeding basics reviewed;Assisted with latch;Skin to skin;Breast massage;Hand express;Expressed milk;Support pillows;Position options  Lactation Tools Discussed/Used WIC Program: Yes [facility]   Consult Status Consult Status: Follow-up Date: 01/01/17 Follow-up type: In-patient    Maryruth HancockKelly Suzanne [facility] 12/31/2016, 5:38 PM

## 2016-12-31 NOTE — Progress Notes (Signed)
Anita [name] was referred for history of depression/anxiety. * Referral screened out by Clinical Social Worker because none of the following criteria appear to apply: ~ History of anxiety/depression during this pregnancy, or of post-partum depression. ~ Diagnosis of anxiety and/or depression within last 3 years OR * Anita [name]'s symptoms currently being treated with medication and/or therapy. Please contact the Clinical Social Worker if needs arise, by [facility] request, or if Anita [name] scores 9 or greater/yes to question 10 on [place] Postpartum Depression Screen.  CSW reviewed PNR.  There is no documentation regarding concerns about current depressive symptoms.

## 2017-01-01 LAB — CBC
HCT: 30.5 % — ABNORMAL LOW (ref 36.0–46.0)
HEMOGLOBIN: 10.7 g/dL — AB (ref 12.0–15.0)
MCH: 32 pg (ref 26.0–34.0)
MCHC: 35.1 g/dL (ref 30.0–36.0)
MCV: 91.3 fL (ref 78.0–100.0)
Platelets: 186 10*3/uL (ref 150–400)
RBC: 3.34 MIL/uL — AB (ref 3.87–5.11)
RDW: 14.6 % (ref 11.5–15.5)
WBC: 10.4 10*3/uL (ref 4.0–10.5)

## 2017-01-01 LAB — BIRTH TISSUE RECOVERY COLLECTION (PLACENTA DONATION)

## 2017-01-01 NOTE — Progress Notes (Signed)
Subjective: Postpartum Day 1: Cesarean Delivery Patient reports feeling well. She denies chest pain, SOB, lightheadedness/dizziness. She ambulated in the room. She is tolerating po    Objective: Vital signs in last 24 hours: Temp:  [97.5 F (36.4 C)-98.4 F (36.9 C)] 97.9 F (36.6 C) (08/14 0300) Pulse Rate:  [47-76] 60 (08/14 0400) Resp:  [15-19] 18 (08/14 0300) BP: (90-107)/(35-70) 102/54 (08/14 0400) SpO2:  [96 %-100 %] 97 % (08/14 0300) Weight:  [164 lb (74.4 kg)] 164 lb (74.4 kg) (08/13 0740)  Physical Exam:  General: alert, cooperative and no distress Lochia: appropriate Uterine Fundus: firm Incision: honeycomb dressing: clean/dry and intact DVT Evaluation: No evidence of DVT seen on physical exam. No cords or calf tenderness.   Recent Labs  01/01/17 0504  HGB 10.7*  HCT 30.5*    Assessment/Plan: Status post Cesarean section. Doing well postoperatively.  Continue current care Encourage ambulation today. Breastfeeding care with lactation consultant  Pike Scantlebury 01/01/2017, 6:22 AM

## 2017-01-01 NOTE — Lactation Note (Signed)
This note was copied from a baby's chart. Lactation Consultation Note  Patient Name: Anita [name] ZOXWR'UToday's Date: 01/01/2017 Reason for consult: Follow-up assessment;Mother's request;Early term 37-38.6wks;Nipple pain/trauma   Follow up with mom of 24 hour old infant at mom's request. Mom reports infant is pinching her with feeding. She is noted to have a positional stripe to right nipple. Mom has comfort gels in the room to use between feedings after EBM applied.   Infant was latched to left nipple and was removed, nipple was noted to be elongated on the top of the nipple. Infant was realigned and relatched to the left breast in the cross cradle position. Mom reports latch felt better. Mom was pulling back on the breast to give breathing space, enc mom to use positioning to make sure airway is open vs giving breathing space.   Report to Orvilla CornwallSyndey Flynt, RN. RN to set up pump later if infant not latching well. Enc mom to latch infant with each feeding as he tolerates before giving formula. Mom voiced understanding. Mom to call out for further assistance as needed. Mom still very sleepy and drowsy with visit. Her sister and a female person in the room to hear instructions and teaching.    Maternal Data Formula Feeding for Exclusion: Yes Reason for exclusion: Mother's choice to formula and breast feed on admission Has patient been taught Hand Expression?: Yes  Feeding Feeding Type: Breast Fed Length of feed: 10 min (still feeding when LC left room)  LATCH Score Latch: Grasps breast easily, tongue down, lips flanged, rhythmical sucking.  Audible Swallowing: A few with stimulation  Type of Nipple: Everted at rest and after stimulation  Comfort (Breast/Nipple): Filling, red/small blisters or bruises, mild/mod discomfort  Hold (Positioning): Assistance needed to correctly position infant at breast and maintain latch.  LATCH Score: 7  Interventions Interventions: Breast feeding  basics reviewed;Support pillows;Assisted with latch;Expressed milk;Hand express;Comfort gels  Lactation Tools Discussed/Used Tools: Comfort gels   Consult Status Consult Status: Follow-up Date: 01/02/17 Follow-up type: In-patient    Silas FloodSharon S Estefany Goebel 01/01/2017, 10:34 AM

## 2017-01-02 NOTE — Lactation Note (Signed)
This note was copied from a baby's chart. Lactation Consultation Note  Patient Name: Anita [name] ZOXWR'UToday's Date: 01/02/2017 Reason for consult: Follow-up assessment;Difficult latch  Baby 50 hours old. Mom reports that baby able to latch and nurse better within the last hour. However, mom reports that she is having more trouble latching to right breast--which has a shorter shaft. Assisted mom to latch baby to right breast in football position. Assisted mom with positioning, using pillows for support. Baby able to latch deeply and suckle rhythmically with a few swallows noted. Mom reported increased comfort on right breast. [name], RN will start shells with mom after this feeding. Enc mom to use EBM and comfort gels after nursing and to call for assistance with latching as needed. Mom reports that she is not sure how much nursing she will do because she likes using formula. Reviewed the benefits of breast milk.   Discussed assessment and interventions with Tamela OddiBetsy, RN.   Maternal Data    Feeding Feeding Type: Breast Fed Length of feed: 15 min  LATCH Score Latch: Grasps breast easily, tongue down, lips flanged, rhythmical sucking.  Audible Swallowing: A few with stimulation  Type of Nipple: Everted at rest and after stimulation (shorter shaft on right breast.)  Comfort (Breast/Nipple): Filling, red/small blisters or bruises, mild/mod discomfort  Hold (Positioning): Assistance needed to correctly position infant at breast and maintain latch.  LATCH Score: 7  Interventions Interventions: Breast feeding basics reviewed;Assisted with latch;Skin to skin;Hand express;Breast compression;Adjust position;Support pillows;Position options;Expressed milk;Comfort gels  Lactation Tools Discussed/Used Tools: Pump;Comfort gels;Shells Shell Type: Sore;Other (comment) (short shaft.) Breast pump type: Double-Electric Breast Pump   Consult Status Consult Status: Follow-up Date:  01/03/17 Follow-up type: In-patient    Sherlyn HayJennifer D Yojan Paskett 01/02/2017, 12:45 PM

## 2017-01-02 NOTE — Progress Notes (Signed)
Subjective: Postpartum Day 2: Cesarean Delivery and BTL Patient reports feeling well. She denies chest pain, SOB, lightheadedness/dizziness. She ambulated in the room. She is tolerating po. Has had flatus.  Objective: Vital signs in last 24 hours: Temp:  [98.1 F (36.7 C)-98.8 F (37.1 C)] 98.3 F (36.8 C) (08/15 0543) Pulse Rate:  [56-60] 60 (08/15 0543) Resp:  [16-18] 18 (08/15 0543) BP: (94-102)/(48-58) 94/50 (08/15 0543) SpO2:  [99 %-100 %] 99 % (08/14 1719)  Physical Exam:  General: alert, cooperative and no distress Lochia: appropriate Uterine Fundus: firm Incision: honeycomb dressing: clean/dry and intact DVT Evaluation: No evidence of DVT seen on physical exam. No cords or calf tenderness.   Recent Labs  01/01/17 0504  HGB 10.7*  HCT 30.5*    Assessment/Plan: Status post Cesarean section and BTL. Doing well postoperatively.  Continue current care Encourage ambulation Breastfeeding care with lactation consultant Plan for discharge tomorrow  Caryl AdaJazma Huber Mathers, DO OB Fellow 01/02/2017, 8:00 AM

## 2017-01-02 NOTE — Lactation Note (Signed)
This note was copied from a baby's chart. Lactation Consultation Note  Patient Name: Anita [name] VWUJW'JToday's Date: 01/02/2017 Reason for consult: Follow-up assessment;Difficult latch  Baby 54 hours old. Mom called for this LC to assist with latch. Assisted mom to wake baby by unwrapping and removing hat and stimulating baby to wake. Then baby able to latch to right breast again. Mom reports increased comfort and feels she can latch baby herself now. Enc mom to call for her nurse for assistance with latching as needed.   Maternal Data    Feeding Feeding Type: Breast Fed Length of feed: 5 min  LATCH Score Latch: Grasps breast easily, tongue down, lips flanged, rhythmical sucking.  Audible Swallowing: A few with stimulation  Type of Nipple: Everted at rest and after stimulation  Comfort (Breast/Nipple): Filling, red/small blisters or bruises, mild/mod discomfort  Hold (Positioning): Assistance needed to correctly position infant at breast and maintain latch.  LATCH Score: 7  Interventions Interventions: Breast feeding basics reviewed;Assisted with latch;Skin to skin;Hand express;Breast compression;Adjust position;Support pillows;Position options;Expressed milk;Comfort gels  Lactation Tools Discussed/Used Tools: Pump;Comfort gels;Shells Shell Type: Sore;Other (comment) (short shaft.) Breast pump type: Double-Electric Breast Pump   Consult Status Consult Status: Follow-up Date: 01/03/17 Follow-up type: In-patient    Sherlyn HayJennifer D Lonnetta Kniskern 01/02/2017, 4:14 PM

## 2017-01-03 MED ORDER — IBUPROFEN 600 MG PO TABS: 600.0000 mg | ORAL_TABLET | Freq: Four times a day (QID) | ORAL | 0 refills | Status: DC

## 2017-01-03 MED ORDER — SIMETHICONE 80 MG PO CHEW: 80.0000 mg | CHEWABLE_TABLET | Freq: Three times a day (TID) | ORAL | 0 refills | Status: DC

## 2017-01-03 MED ORDER — OXYCODONE-ACETAMINOPHEN 5-325 MG PO TABS: 1.0000 | ORAL_TABLET | Freq: Four times a day (QID) | ORAL | 0 refills | Status: DC | PRN

## 2017-01-03 NOTE — Lactation Note (Signed)
This note was copied from a baby's chart. Lactation Consultation Note  Patient Name: Anita [name] ZOXWR'UToday's Date: 01/03/2017 Reason for consult: Follow-up assessment;Difficult latch  Baby 72 hours old. Mom reports that she is using a manual pump now and is giving baby EBM along with formula. [facility] with [facility]. [facility]. Offered to assist with latching baby, but mom states that she has put baby to breast, but thinks she may just pump and bottle-feed. Mom requested a second manual pump, and it was given. Mom also show how to pump with piston. Referred mom to "Mother and Baby Care" booklet for EBM storage guidelines. Mom aware of OP/BFSG and LC phone line assistance after D/C.   Maternal Data    Feeding Feeding Type: Bottle Fed - Formula  LATCH Score                   Interventions    Lactation Tools Discussed/Used     Consult Status Consult Status: PRN    Sherlyn HayJennifer D Alexiana Laverdure 01/03/2017, 10:12 AM

## 2017-01-03 NOTE — Discharge Summary (Signed)
OB Discharge Summary  Patient Name: Anita [name] DOB: 30-Apr-1991 MRN: [id number]  Date of admission: 12/31/2016 Delivering MD: Frederik Pear   Date of discharge: 01/03/2017  Admitting diagnosis: RCS Undesired Fertility Intrauterine pregnancy: [redacted]w[redacted]d     Secondary diagnosis:Active Problems:   Status post repeat low transverse cesarean section and BTL     Discharge diagnosis: Term Pregnancy Delivered                                                                      Complications: None  Hospital course:  Sceduled C/S   26 y.o. yo [id number] at [redacted]w[redacted]d was admitted to the hospital 12/31/2016 for scheduled cesarean section with the following indication:Elective Repeat.  Membrane Rupture Time/Date: 9:55 AM ,12/31/2016   Patient delivered a Viable infant.12/31/2016  Details of operation can be found in separate operative note.  Pateint had an uncomplicated postpartum course.  She is ambulating, tolerating a regular diet, passing flatus, and urinating well. Patient is discharged home in stable condition on  01/03/17         Physical exam  Vitals:   01/01/17 1719 01/02/17 0543 01/02/17 1812 01/03/17 0553  BP: (!) 102/50 (!) 94/50 (!) 100/41 (!) 102/57  Pulse: (!) 58 60 (!) 58 68  Resp: 16 18 18 18   Temp: 98.1 F (36.7 C) 98.3 F (36.8 C) 98.7 F (37.1 C) 97.8 F (36.6 C)  TempSrc: Oral Oral Axillary Oral  SpO2: 99%     Weight:      Height:       General: alert Lochia: appropriate Uterine Fundus: firm Incision: Dressing is clean, dry, and intact DVT Evaluation: No evidence of DVT seen on physical exam. Labs: Lab Results  Component Value Date   WBC 10.4 01/01/2017   HGB 10.7 (L) 01/01/2017   HCT 30.5 (L) 01/01/2017   MCV 91.3 01/01/2017   PLT 186 01/01/2017   CMP Latest Ref Rng & Units 08/30/2016  Glucose 65 - 99 mg/dL 71  BUN 6 - 20 mg/dL 5(L)  Creatinine 1.47 - 1.00 mg/dL 8.29(F)  Sodium 621 - 308 mmol/L 140  Potassium 3.5 - 5.2 mmol/L 4.3  Chloride 96  - 106 mmol/L 101  CO2 18 - 29 mmol/L 22  Calcium 8.7 - 10.2 mg/dL 9.2  Total Protein 6.0 - 8.5 g/dL 6.7  Total Bilirubin 0.0 - 1.2 mg/dL 0.4  Alkaline Phos 39 - 117 IU/L 65  AST 0 - 40 IU/L 21  ALT 0 - 32 IU/L 19    Discharge instruction: per After Visit Summary and "Baby and Me Booklet".  After Visit Meds:  Allergies as of 01/03/2017   No Known Allergies     Medication List    TAKE these medications   ibuprofen 600 MG tablet Commonly known as:  ADVIL,MOTRIN Take 1 tablet (600 mg total) by mouth every 6 (six) hours.   prenatal multivitamin Tabs tablet Take 1 tablet by mouth daily at 12 noon.   simethicone 80 MG chewable tablet Commonly known as:  MYLICON Chew 1 tablet (80 mg total) by mouth 3 (three) times daily after meals.       Diet: routine diet  Activity: Advance as tolerated. Pelvic rest for 6 weeks.  Outpatient follow up:4 weeks Follow up Appt:Future Appointments Date Time Provider Department Center  01/29/2017 2:00 PM Bardwell BingPickens, Charlie, MD [facility]   Follow up visit: No Follow-up on file.  Postpartum contraception: Tubal Ligation  Newborn Data: Live born female  Birth Weight: 6 lb 10 oz (3005 g) APGAR: 8, 8  Baby Feeding: Breast Disposition:home with mother   01/03/2017 Allie BossierMyra C Marques Ericson, MD

## 2017-01-03 NOTE — Discharge Instructions (Signed)

## 2017-01-10 ENCOUNTER — Inpatient Hospital Stay (HOSPITAL_COMMUNITY)
Admission: AD | Admit: 2017-01-10 | Discharge: 2017-01-10 | Disposition: A | Discharge status: Home / Self Care | Payer: Medicaid Other | Source: Ambulatory Visit | Attending: Obstetrics & Gynecology | Admitting: Obstetrics & Gynecology

## 2017-01-10 ENCOUNTER — Encounter (HOSPITAL_COMMUNITY): Payer: Self-pay | Admitting: *Deleted

## 2017-01-10 DIAGNOSIS — O864 Pyrexia of unknown origin following delivery: Secondary | ICD-10-CM | POA: Diagnosis present

## 2017-01-10 DIAGNOSIS — Z98891 History of uterine scar from previous surgery: Secondary | ICD-10-CM

## 2017-01-10 DIAGNOSIS — Z8759 Personal history of other complications of pregnancy, childbirth and the puerperium: Secondary | ICD-10-CM | POA: Diagnosis not present

## 2017-01-10 DIAGNOSIS — I10 Essential (primary) hypertension: Secondary | ICD-10-CM

## 2017-01-10 DIAGNOSIS — Z9889 Other specified postprocedural states: Secondary | ICD-10-CM | POA: Insufficient documentation

## 2017-01-10 DIAGNOSIS — N61 Mastitis without abscess: Secondary | ICD-10-CM | POA: Diagnosis not present

## 2017-01-10 DIAGNOSIS — O9122 Nonpurulent mastitis associated with the puerperium: Secondary | ICD-10-CM

## 2017-01-10 DIAGNOSIS — Z87891 Personal history of nicotine dependence: Secondary | ICD-10-CM | POA: Insufficient documentation

## 2017-01-10 LAB — CBC WITH DIFFERENTIAL/PLATELET
BASOS PCT: 0 %
Basophils Absolute: 0 10*3/uL (ref 0.0–0.1)
EOS ABS: 0.2 10*3/uL (ref 0.0–0.7)
EOS PCT: 1 %
HEMATOCRIT: 37.2 % (ref 36.0–46.0)
HEMOGLOBIN: 12.9 g/dL (ref 12.0–15.0)
LYMPHS ABS: 0.8 10*3/uL (ref 0.7–4.0)
Lymphocytes Relative: 5 %
MCH: 31.5 pg (ref 26.0–34.0)
MCHC: 34.7 g/dL (ref 30.0–36.0)
MCV: 91 fL (ref 78.0–100.0)
MONO ABS: 0.1 10*3/uL (ref 0.1–1.0)
MONOS PCT: 1 %
NEUTROS PCT: 93 %
Neutro Abs: 14.4 10*3/uL — ABNORMAL HIGH (ref 1.7–7.7)
Platelets: 241 10*3/uL (ref 150–400)
RBC: 4.09 MIL/uL (ref 3.87–5.11)
RDW: 14.2 % (ref 11.5–15.5)
WBC: 15.4 10*3/uL — ABNORMAL HIGH (ref 4.0–10.5)

## 2017-01-10 LAB — URINALYSIS, ROUTINE W REFLEX MICROSCOPIC
BILIRUBIN URINE: NEGATIVE
Bacteria, UA: NONE SEEN
Glucose, UA: NEGATIVE mg/dL
HGB URINE DIPSTICK: NEGATIVE
KETONES UR: NEGATIVE mg/dL
Leukocytes, UA: NEGATIVE
Nitrite: NEGATIVE
PH: 5 (ref 5.0–8.0)
Protein, ur: 30 mg/dL — AB
Specific Gravity, Urine: 1.031 — ABNORMAL HIGH (ref 1.005–1.030)

## 2017-01-10 LAB — COMPREHENSIVE METABOLIC PANEL
ALK PHOS: 94 U/L (ref 38–126)
ALT: 28 U/L (ref 14–54)
AST: 25 U/L (ref 15–41)
Albumin: 3.3 g/dL — ABNORMAL LOW (ref 3.5–5.0)
Anion gap: 8 (ref 5–15)
BILIRUBIN TOTAL: 1.5 mg/dL — AB (ref 0.3–1.2)
BUN: 14 mg/dL (ref 6–20)
CO2: 24 mmol/L (ref 22–32)
CREATININE: 0.81 mg/dL (ref 0.44–1.00)
Calcium: 8.8 mg/dL — ABNORMAL LOW (ref 8.9–10.3)
Chloride: 102 mmol/L (ref 101–111)
GFR calc Af Amer: >60 mL/min (ref >60)
Glucose, Bld: 93 mg/dL (ref 65–99)
Potassium: 3.9 mmol/L (ref 3.5–5.1)
Sodium: 134 mmol/L — ABNORMAL LOW (ref 135–145)
TOTAL PROTEIN: 6.7 g/dL (ref 6.5–8.1)

## 2017-01-10 LAB — PROTEIN / CREATININE RATIO, URINE
CREATININE, URINE: 313 mg/dL
Protein Creatinine Ratio: 0.08 mg/mg{Cre} (ref 0.00–0.15)
Total Protein, Urine: 25 mg/dL

## 2017-01-10 MED ORDER — ACETAMINOPHEN 325 MG PO TABS
650.0000 mg | ORAL_TABLET | Freq: Once | ORAL | Status: AC
  Administered 2017-01-10: 650 mg via ORAL
  Filled 2017-01-10: qty 2

## 2017-01-10 MED ORDER — CEPHALEXIN 500 MG PO CAPS: 500.0000 mg | ORAL_CAPSULE | Freq: Four times a day (QID) | ORAL | 0 refills | Status: DC

## 2017-01-10 MED ORDER — IBUPROFEN 600 MG PO TABS
600.0000 mg | ORAL_TABLET | Freq: Once | ORAL | Status: AC
  Administered 2017-01-10: 600 mg via ORAL
  Filled 2017-01-10: qty 1

## 2017-01-10 MED ORDER — CEPHALEXIN 500 MG PO CAPS
500.0000 mg | ORAL_CAPSULE | Freq: Once | ORAL | Status: AC
  Administered 2017-01-10: 500 mg via ORAL
  Filled 2017-01-10: qty 1

## 2017-01-10 NOTE — MAU Note (Signed)
Pt presents w/ c/o fever 100.4  & chills last night.  Denies drainage from incision.  Reports repeat C/S December 31, 2016.

## 2017-01-10 NOTE — MAU Provider Note (Signed)
History  Chief Complaint:  Fever; Chills; and Post-op Problem  Anita [name] is a 26 y.o. [phone] female s/p rTLCS POD#10 who presents with fevers and chills of 12h duration. She states that she started to feel warm last night and checked her temperature which was 100.4. She endorses diffuse body aches, chills, sweating and significant bilateral breast pain. She says her breasts feel full and endorses substantial pain with breastfeeding and pumping. She also states there are areas of redness on both breasts. She denies n/v/d/c, sore throat, HA, nasal discharge. She denies vaginal bleeding or discharge, urinary sx. She denies swelling, warmth, discharge and significant pain from incision site.   She received regular prenatal care and had unremarkable prenatal labs, GBS negative. C-section and post-partum course were uncomplicated.   Was breastfeeding but baby wasn't latching well, so she pumped a few times then stopped pumping.  Was worried about feeding milk while she had a fever. Only has a manual pump at home.   Obstetrical History: OB History    Gravida Para Term Preterm AB Living   4 3 1 2 1 2    SAB TAB Ectopic Multiple Live Births   1       2      Obstetric Comments   1st pregnancy, water broke, hospitalized, abx, then bleeding and C/S 2nd pregnancy, loss at 4 weeks 3rd pregnancy, Cerclage due to previous losses then repeat C/S      Past Medical History: Past Medical History:  Diagnosis Date  . Depression    takes no meds    Past Surgical History: Past Surgical History:  Procedure Laterality Date  . CERVICAL CERCLAGE  07/03/2011   Procedure: CERCLAGE CERVICAL;  Surgeon: Kathreen Cosier, MD;  Location: [facility];  Service: Gynecology;  Laterality: N/A;  . CESAREAN SECTION  2010   51month fetal demise..   . CESAREAN SECTION  12/04/2011   Procedure: CESAREAN SECTION;  Surgeon: Kathreen Cosier, MD;  Location: [facility];  Service: Gynecology;  Laterality: N/A;  .  CESAREAN SECTION WITH BILATERAL TUBAL LIGATION Bilateral 12/31/2016   Procedure: REPEAT CESAREAN SECTION;  Surgeon: Willodean Rosenthal, MD;  Location: [facility] SU[facility];  Service: Obstetrics;  Laterality: Bilateral;    Social History: Social History   Social History  . Marital status: Single    Spouse name: N/A  . Number of children: N/A  . Years of education: N/A   Social History Main Topics  . Smoking status: Former Smoker    Types: Cigarettes    Quit date: 07/02/2010  . Smokeless tobacco: Never Used  . Alcohol use No  . Drug use: No  . Sexual activity: Not Currently    Birth control/ protection: None   Other Topics Concern  . None   Social History Narrative  . None    Allergies: No Known Allergies  Prescriptions Prior to Admission  Medication Sig Dispense Refill Last Dose  . ibuprofen (ADVIL,MOTRIN) 600 MG tablet Take 1 tablet (600 mg total) by mouth every 6 (six) hours. 30 tablet 0   . oxyCODONE-acetaminophen (PERCOCET/ROXICET) 5-325 MG tablet Take 1-2 tablets by mouth every 6 (six) hours as needed. 30 tablet 0   . Prenatal Vit-Fe Fumarate-FA (PRENATAL MULTIVITAMIN) TABS tablet Take 1 tablet by mouth daily at 12 noon.   Taking  . simethicone (MYLICON) 80 MG chewable tablet Chew 1 tablet (80 mg total) by mouth 3 (three) times daily after meals. 30 tablet 0     Review of Systems  Pertinent  pos/neg as indicated in HPI  Physical Exam  Blood pressure 129/67, pulse (!) 107, temperature (!) 102.4 F (39.1 C), temperature source Oral, resp. rate 20, weight 66.7 kg (147 lb 1.9 oz), SpO2 100 %, unknown if currently breastfeeding. General appearance: cooperative, no distress and tired appearing, resting in bed Lungs: normal work of breathing Abdomen: soft, non-tender Breast exam: notable areas of erythema on R and L upper breast, diffusely warm and firm to palpation without induration or masses,TTP bilaterally, slight milky discharge from L nipple  Incision:  c/d/i Extremities: no edema  MAU Course  MDM UA, CBC Physical exam and history  Lactation evaluation MAU observation Patient verbalized understanding and agreement with assessment and plan  Labs:  Results for orders placed or performed during the hospital encounter of 01/10/17 (from the past 24 hour(s))  Urinalysis, Routine w reflex microscopic     Status: Abnormal   Collection Time: 01/10/17 12:15 PM  Result Value Ref Range   Color, Urine AMBER (A) YELLOW   APPearance HAZY (A) CLEAR   Specific Gravity, Urine 1.031 (H) 1.005 - 1.030   pH 5.0 5.0 - 8.0   Glucose, UA NEGATIVE NEGATIVE mg/dL   Hgb urine dipstick NEGATIVE NEGATIVE   Bilirubin Urine NEGATIVE NEGATIVE   Ketones, ur NEGATIVE NEGATIVE mg/dL   Protein, ur 30 (A) NEGATIVE mg/dL   Nitrite NEGATIVE NEGATIVE   Leukocytes, UA NEGATIVE NEGATIVE   RBC / HPF 0-5 0 - 5 RBC/hpf   WBC, UA 6-30 0 - 5 WBC/hpf   Bacteria, UA NONE SEEN NONE SEEN   Squamous Epithelial / LPF 0-5 (A) NONE SEEN   Mucus PRESENT   CBC with Differential/Platelet     Status: Abnormal   Collection Time: 01/10/17  1:07 PM  Result Value Ref Range   WBC 15.4 (H) 4.0 - 10.5 K/uL   RBC 4.09 3.87 - 5.11 MIL/uL   Hemoglobin 12.9 12.0 - 15.0 g/dL   HCT 40.9 81.1 - 91.4 %   MCV 91.0 78.0 - 100.0 fL   MCH 31.5 26.0 - 34.0 pg   MCHC 34.7 30.0 - 36.0 g/dL   RDW 78.2 95.6 - 21.3 %   Platelets 241 150 - 400 K/uL   Neutrophils Relative % 93 %   Neutro Abs 14.4 (H) 1.7 - 7.7 K/uL   Lymphocytes Relative 5 %   Lymphs Abs 0.8 0.7 - 4.0 K/uL   Monocytes Relative 1 %   Monocytes Absolute 0.1 0.1 - 1.0 K/uL   Eosinophils Relative 1 %   Eosinophils Absolute 0.2 0.0 - 0.7 K/uL   Basophils Relative 0 %   Basophils Absolute 0.0 0.0 - 0.1 K/uL    Imaging:  None  Assessment and Plan  A:  Anita [name] is a 08MV [id number] who presents with postpartum fever of 12h duration. History and physical exam are highly suggestive of bilateral mastitis, and history is  reassuring for no intraabdominal process.   P:  Mastitis: Keflex 500mg  QID for 10 days, first dose given in MAU. Pt may alternate tylenol/ ibuprofen for symptomatic relief. Patient advised to continue breastfeeding/pumping to facilitate breast emptying. Return precautions given regarding new onset, worsening of symptoms and failure of symptom resolution.   Dannette Barbara, Medical Student 8/23/20182:27 PM  I confirm that I have verified the information documented in the Med student's note and that I have also personally reperformed the physical exam and all medical decision making activities.  The patient was seen and examined by me also Agree with  note  Ready for discharge Will have her followup in clinic as scheduled Aviva Signs, CNM

## 2017-01-10 NOTE — Lactation Note (Addendum)
Lactation Consultation Note  Patient Name: Anita [name] ZOXWR'U Date: 01/10/2017 Reason for consult: Follow-up assessment;MD order;Nipple pain/trauma;Engorgement;Other (Comment) (Mastitis, fever)   Called to MAU to consult on mom 10 days PP with fever and suspected bilateral Mastitis. Mom reports infant hurts her to latch to the breast and she has been pumping with manual pump and providing some breast milk and some formula. She reports pain with the pump to inner aspects of breasts so she suddenly weaned from pumping about 6 x a day 2 days ago to 1 x a day last night. She reports it is painful to latch infant to the breast so she does not.   Mom is noted to be severely engorged with a moderate amount of pain to the breasts. Mom was brought a DEBP and pumped off 9 oz in 20 minutes. She is noted to have reddened area to both breasts in the upper inner quadrants with left breast more noticeable than the right. After pumping she is noted to have a firm triangle to the inner upper left breast in the same area as the redness that did not fully soften with pumping. She is thought to have a plugged duct that is also consistent with her reported symptoms. She has been wearing a binder that she made out of a guaze baby blanket. She is noted to have small scabs to center of there nipples. Mom reports her breasts feel much better after pumping.   Mom initially told me she wanted to wean pumping completely and then she reported she wants to rent a DEBP for 10 days and keep pumping. Mom is awaiting dad to arrive to bring money needed for pump rental. Paperwork is completed for pump rental.  Discussed care of plugged duct, mastitis, and drying up milk if mom decides to follow through with that. Mom reports her son has not tolerated his formula very well and pediatrician is changing infant to soy formula. Discussed with mom that she is still in a good place to be able to provide breast milk for infant. Mom  reports she is a [facility] client but does not want to get a pump from them as they will cut her formula supply.   Report to [place], RN and Wynelle Bourgeois, NNP. Anita [name] is to call when mom able to be d/c and FOB arrives to rent pump.   Plan of care created and written plan given to mom:  1. Apply warm moist heat or take warm shower for 15-20 minutes to left breast before pumping 2. Pump using double electric every 2-3 hours for 15-20 minutes or until breasts are soft 3. Massage breasts before and during feeding 4. Can apply ice packs after pumping for 15 minutes for comfort for up to 24 hours-then stop ice 5. Take Tylenol or Motrin as prescribed 6. Take antibiotics as prescribed until they are complete 7. If planning to dry up milk, do not plan on doing so for the next several days,  then can -Apply ice packs or cabbage leaves every 1-2 hours and then begin weaning pumping by dropping one pump session a day every 2-3 days until weaned off.  8. Call for any questions/concerns as needed ([phone]) 9. Can call [phone] to schedule outpatient appointment to assist with latch and or follow up with Mastitis.    Maternal Data Formula Feeding for Exclusion: Yes Reason for exclusion: Mother's choice to formula and breast feed on admission  Feeding    LATCH Score  Interventions    Lactation Tools Discussed/Used WIC Program: Yes Pump Review: Setup, frequency, and cleaning Initiated by:: Anita Stain, RN, IBCLC   Consult Status Consult Status: Follow-up Date: 01/10/17 Follow-up type:  (Maternity admissions)    Silas Flood [facility] 01/10/2017, 3:41 PM

## 2017-01-10 NOTE — Lactation Note (Signed)
Lactation Consultation Note  Patient Name: Anita [name] HTXHF'[id number] Date: 01/10/2017 Reason for consult: Follow-up assessment;MD order;Nipple pain/trauma;Engorgement;Other (Comment) (Mastitis, fever)  Mom given [facility] loaner in waiting room of MAU. Mom knows where to return and enc to come between hours of 8am-4pm to collect money. FOB has pumping kit in bag and mom states that she knows how to use. Enc mom to call for assistance as needed.   Maternal Data Formula Feeding for Exclusion: Yes Reason for exclusion: Mother's choice to formula and breast feed on admission  Feeding    LATCH Score                   Interventions    Lactation Tools Discussed/Used WIC Program: Yes Pump Review: Setup, frequency, and cleaning Initiated by:: Nonah Mattes, RN, IBCLC   Consult Status Consult Status: Follow-up Date: 01/10/17 Follow-up type:  [facility] admissions)    Andres Labrum 01/10/2017, 5:28 PM

## 2017-01-10 NOTE — MAU Note (Signed)
Patient states had a fever last night 104 +fever and chills Has not taken anything other than ibuprofen  +lower left incisional pain Denies drainage from site  PP C/S 8/20

## 2017-01-10 NOTE — Discharge Instructions (Signed)
Amamantamiento y mastitis (Breastfeeding and Mastitis) La mastitis es una inflamacin en el tejido [place]. Puede aparecer en las mujeres que estn amamantando. Esto puede hacer que el amamantamiento sea doloroso. En muchos casos la mastitis desaparecer por s misma. El mdico determinar si necesita tratamiento. CAUSAS Muchas veces se asocia a una obstruccin de los conductos por los que pasa la [place] (lactiferos). Esto ocurre cuando se acumula mucha [employer]. Las causas del exceso de [employer] en la mama pueden ser:  El beb no se prende bien al [employer]. Si el beb no se prende adecuadamente, es probable que no vace la mama completamente al alimentarse.  Dejar que pase mucho tiempo entre cada alimentacin.  Usar un sostn u otras prendas que sean muy [place]. Esto hara mucha presin en los conductos lactferos por lo tanto la [place] no fluye a travs de ellos como debera. La mastitis tambin puede ser [place] en una infeccin bacteriana. Las bacterias que causan la infeccin pueden entrar a travs de cortes o grietas en la piel al tejido [place]. En las mujeres que [place], esto puede ocurrir debido a Programmer, applications o irritacin de la piel. Las [employer] en la piel pueden producirse cuando el beb no se prende adecuadamente al pecho. SIGNOS Y SNTOMAS  Hinchazn, enrojecimiento, sensibilidad y dolor en la zona de la mama.  Hinchazn de los ganglios que se encuentran debajo del brazo, en el mismo lado.  La fiebre puede o no acompaar a la mastitis. Si se permite que la infeccin progrese, podr formarse una acumulacin de pus (absceso). DIAGNSTICO El mdico podr habitualmente diagnosticar mastitis en base a sus sntomas y al examen fsico. Le indicarn anlisis para confirmar el diagnstico. Estos pueden ser:  Extraccin del pus de la mama, aplicando presin en la zona. El pus se examinar en el laboratorio para determinar de qu bacteria se trata. Si hay un absceso, debern retirarle el  lquido con [place]. Con el lquido se confirmar el diagnstico y se Production assistant, radio bacteria que causa el problema. En la [employer] no se observa pus.  Le indicarn anlisis de sangre para determinar si su organismo est luchando contra una infeccin bacteriana.  Una mamografa o una ecografa podrn descartar otros problemas o enfermedades. TRATAMIENTO En muchos casos la mastitis que se produce por el amamantamiento desaparecer por s misma. El mdico podr indicarle que espere 24 horas despus de verla por primera vez para decidir si necesita recetarle un medicamento. Si los sntomas empeoran despus de 24 horas, el Nurse, adult un antibitico para tratar la mastitis. El Nurse, children's qu bacteria es ms probable que cause la infeccin y Art gallery manager el tipo de antibitico ms adecuado. Podr cambiarlo segn el resultado del cultivo o si la respuesta al antibitico no es la [place]. Los antibiticos se administran por va oral. Le recetar medicamentos para Chief Technology Officer. INSTRUCCIONES PARA EL CUIDADO EN EL HOGAR  Slo tome medicamentos de venta libre o recetados para Primary school teacher, Environmental health practitioner o bajar la fiebre, segn las indicaciones de su mdico.  Si su mdico le receta antibiticos, tmelos tal [employer] indic. Asegrese de que finaliza la prescripcin completa aunque se sienta mejor.  No use un sostn demasiado ajustado o con aro. Use un sostn suave, de soporte.  Aumente la ingestin de lquidos, especialmente si tiene fiebre.  Contine hasta vaciar la mama. El Ecologist informar si su leche es segura para el beb o debe descartarla. Podrn indicarle que deje de Museum/gallery exhibitions officer  hasta que el mdico considere que es seguro para su beb. Use un sacaleche si le aconsejan dejar de amamantar.  Mantenga los pezones secos y limpios.  Vace la primera mama completamente antes de amamantar con la segunda. Si el beb no vaca la mama por algn motivo, utilice un  sacaleche.  Si debe regresar a Engineer, water, use un sacaleche en el horario de trabajo para [employer] horarios.  Evite que las [address] se llenen mucho de [place] (congestin).  SOLICITE ATENCIN MDICA SI:  Shelle Iron secrecin similar a pus por la mama.  Los sntomas no mejoran con el tratamiento indicado por su mdico dentro de los [address]  [address].  SOLICITE ATENCIN MDICA DE INMEDIATO SI:  El dolor y la hinchazn empeoran.  Aumenta el dolor y no puede controlarlo con Tourist information centre manager.  Observa una lnea roja que se extiende desde la mama hasta la axila.  Tiene fiebre o sntomas persistentes durante ms de 2 - 3 das.  Tiene fiebre y los sntomas empeoran repentinamente.  ASEGRESE DE QUE:  Comprende estas instrucciones.  Controlar su afeccin.  Recibir ayuda de inmediato si no mejora o si empeora.  Esta informacin no tiene Theme park manager el consejo del mdico. Asegrese de hacerle al mdico cualquier pregunta que tenga. Document Released: 10/24/2007 Document Revised: 05/12/2013 Document Reviewed: 12/11/2012 Elsevier Interactive Patient Education  2017 Elsevier Inc.  Hipertensin durante el embarazo (Hypertension During Pregnancy) La hipertensin tambin se denomina presin arterial alta. La presin arterial hace que se mueva la sangre en el cuerpo. A veces, la fuerza que Northrop [employer]umman sangre es demasiado intensa. Cuando est embarazada, esta afeccin se debe controlar atentamente. Puede causar problemas para usted y su beb. CUIDADOS EN EL HOGAR  Cumpla con todos los controles mdicos.  Tome los [employer] Par[employer] como le indic su mdico. Informe a su mdico sobre todos los medicamentos que toma.  Coma muy poca sal.  Haga ejercicios regularmente.  No beba alcohol.  No fume.  No tome bebidas con cafena.  Acustese sobre el lado izquierdo cuando haga reposo.  Su mdico puede recomendarle que tome una aspirina de dosis baja (81 mg) cada da.  SOLICITE AYUDA DE INMEDIATO  SI:  Siente un dolor intenso en el vientre (abdominal).  Nota una hinchazn repentina [employer], los tobillos o la cara.  Aument 4libras (1,8kg) o ms en 1semana.  Vomita) repetidas veces.  Tiene una hemorragia por la vagina.  No siente los movimientos del beb.  Tiene cefalea.  Tiene visin doble o borrosa.  Tiene calambres o espasmos musculares.  Le falta el aire.  Tiene las yemas de los dedos y los labios [place].  Observa sangre en la orina.  ASEGRESE DE QUE:  Comprende estas instrucciones.  Controlar su afeccin.  Recibir ayuda de inmediato si no mejora o si empeora.  Esta informacin no tiene Theme park manager el consejo del mdico. Asegrese de hacerle al mdico cualquier pregunta que tenga. Document Released: 08/22/2010 Document Revised: 05/28/2014 Document Reviewed: 10/21/2015 Elsevier Interactive Patient Education  2017 [employer].

## 2017-01-29 ENCOUNTER — Ambulatory Visit: Payer: Self-pay | Admitting: Obstetrics and Gynecology

## 2017-01-29 ENCOUNTER — Encounter: Payer: Self-pay | Admitting: Obstetrics and Gynecology

## 2017-01-29 NOTE — Progress Notes (Signed)
Patient did not keep postpartum appointment for 01/29/2017.  Cornelia Copaharlie Dontrail Blackwell, Jr MD Attending Center for [facility] Midwife(Faculty Practice)

## 2017-03-29 ENCOUNTER — Encounter: Payer: Self-pay | Admitting: Nurse Practitioner

## 2017-03-29 ENCOUNTER — Ambulatory Visit (INDEPENDENT_AMBULATORY_CARE_PROVIDER_SITE_OTHER): Payer: Self-pay | Admitting: Nurse Practitioner

## 2017-03-29 VITALS — BP 113/59 | HR 72 | Ht 62.0 in | Wt 144.9 lb

## 2017-03-29 DIAGNOSIS — N939 Abnormal uterine and vaginal bleeding, unspecified: Secondary | ICD-10-CM

## 2017-03-29 DIAGNOSIS — Z113 Encounter for screening for infections with a predominantly sexual mode of transmission: Secondary | ICD-10-CM

## 2017-03-29 MED ORDER — MEDROXYPROGESTERONE ACETATE 10 MG PO TABS: 10.0000 mg | ORAL_TABLET | Freq: Every day | ORAL | 0 refills | Status: DC

## 2017-03-29 NOTE — Patient Instructions (Signed)
Get your prescription from the pharmacy. Take one by mouth daily and expect the bleeding to stop. Once you stop the medication, you will bleed again but it should be a regular period.  Call the office if you are still bleeding after 7 days.

## 2017-03-29 NOTE — Progress Notes (Signed)
Subjective:   HPI  Wynelle Linkmelia Sanchez-Garnica is a 26 y.o. female who presents for a postpartum visit - She is now 3 months after her delivery but is still having bothersome vaginal bleeding. She is several weeks postpartum following a low cervical transverse Cesarean section. I have fully reviewed the prenatal and intrapartum course. The delivery was at 37 gestational weeks. Outcome: repeat cesarean section, low transverse incision. Anesthesia: epidural. Postpartum course has been problematic due to heavy extended vaginal bleeding that has never stopped except for 4-5 days at a time. Baby's course has been good. Baby is feeding by bottle Rush Barer- Gerber soothe. Bleeding red. Bowel function is normal. Bladder function is normal. Patient is sexually active. Contraception method is tubal ligation. Postpartum depression screening: negative.  The following portions of the patient's history were reviewed and updated as appropriate: past family history, past social history, past surgical history and problem list.  Client has had a history of irregular menses every 3-6 months and then bleeds for 7-10 days.  Prior to this pregnancy, she had 2-3 years of using condoms so there was no hormonal regulation of her cycles - were still irregular but she did not have extended bleeding like she is having now.  Review of Systems Pertinent items noted in HPI and remainder of comprehensive ROS otherwise negative.   Objective:    There were no vitals taken for this visit.  General:  alert, cooperative and appears stated age   Breasts:  inspection negative, no nipple discharge or bleeding, no masses or nodularity palpable  Lungs: clear to auscultation bilaterally  Heart:  regular rate and rhythm, S1, S2 normal, no murmur, click, rub or gallop  Abdomen: soft, non-tender; bowel sounds normal; no masses,  no organomegaly and C/S scar well healed   Vulva:  normal  Vagina: moderate amount of dark vaginal bleeding noted  Cervix:  no  lesions  Corpus: normal size, contour, position, consistency, mobility, non-tender  Adnexa:  no mass, fullness, tenderness  Rectal Exam: Not performed.        Vitals:   03/29/17 1049  BP: (!) 113/59  Pulse: 72    Assessment:    Normal postpartum exam. Pap smear not done at today's visit.   Abnormal vaginal bleeding - which has not stopped since delivery  Plan:    1. Contraception: tubal ligation 2. Will use 10 days of Provera 10 MG to help the vaginal bleeding to stop and hopefully regulate after finishing this course. Get your prescription from the pharmacy. Take one by mouth daily and expect the bleeding to stop. Once you stop the medication, you will bleed again but it should be a regular period.  Call the office if you are still bleeding after 7 days. 3. Follow up in: 1  year or as needed.   GC/Chlam and Trich pending on vaginal swab. Advised to eat healthy foods and increase exercise and maintain weight below 135 pounds.  Nolene BernheimERRI Benna Arno, RN, MSN, NP-BC Nurse Practitioner, [facility] for [facility], [facility] 03/29/2017 12:47 PM

## 2017-04-01 LAB — CERVICOVAGINAL ANCILLARY ONLY
Chlamydia: NEGATIVE
Neisseria Gonorrhea: NEGATIVE
Trichomonas: NEGATIVE

## 2017-04-28 ENCOUNTER — Other Ambulatory Visit: Payer: Self-pay | Admitting: Advanced Practice Midwife

## 2017-09-13 ENCOUNTER — Emergency Department (HOSPITAL_COMMUNITY): Payer: Medicaid Other

## 2017-09-13 ENCOUNTER — Emergency Department (HOSPITAL_COMMUNITY)
Admission: EM | Admit: 2017-09-13 | Discharge: 2017-09-13 | Discharge status: Against Medical Advice | Payer: Medicaid Other | Attending: Emergency Medicine | Admitting: Emergency Medicine

## 2017-09-13 DIAGNOSIS — Y939 Activity, unspecified: Secondary | ICD-10-CM | POA: Diagnosis not present

## 2017-09-13 DIAGNOSIS — Y92410 Unspecified street and highway as the place of occurrence of the external cause: Secondary | ICD-10-CM | POA: Diagnosis not present

## 2017-09-13 DIAGNOSIS — M542 Cervicalgia: Secondary | ICD-10-CM | POA: Insufficient documentation

## 2017-09-13 DIAGNOSIS — Y998 Other external cause status: Secondary | ICD-10-CM | POA: Insufficient documentation

## 2017-09-13 DIAGNOSIS — Z87891 Personal history of nicotine dependence: Secondary | ICD-10-CM | POA: Insufficient documentation

## 2017-09-13 NOTE — ED Notes (Signed)
Patient reports "I am leaving, it is taking too long, I feel fine, and I need to get to my babies." Ambulatory without difficulty, speaking in full sentences, in NAD.
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
## 2017-11-05 IMAGING — US US MFM OB TRANSVAGINAL
1 series · 9 of 9 positions shown · non-contrast
Comparison: none

[Series 1: us mfm ob transvaginal · 9 acquisitions, 9 frames shown]
[im 1/9]
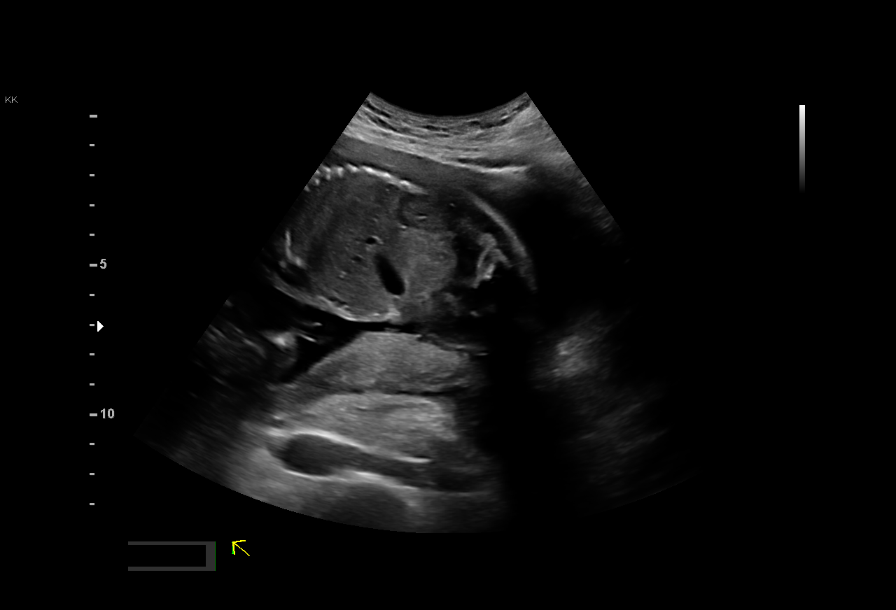
[im 2/9]
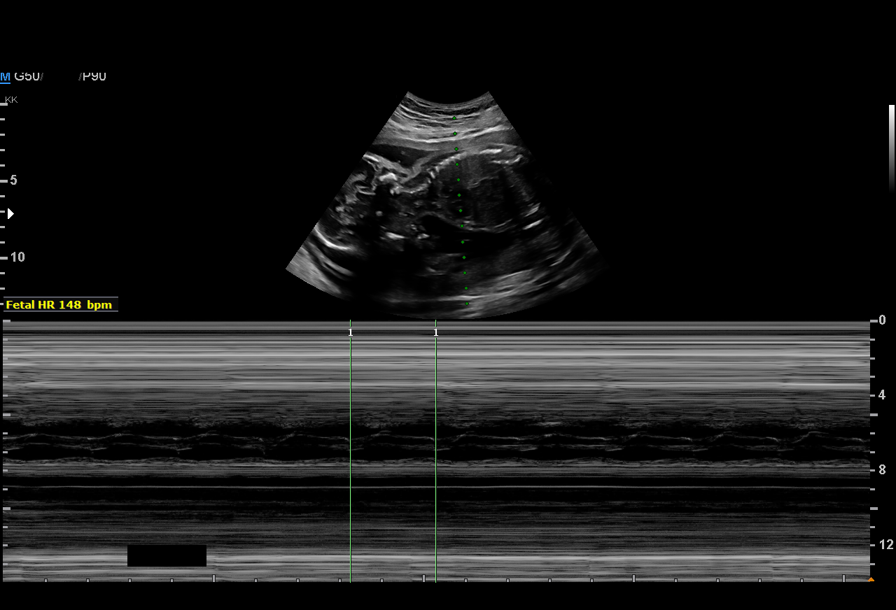
[im 3/9]
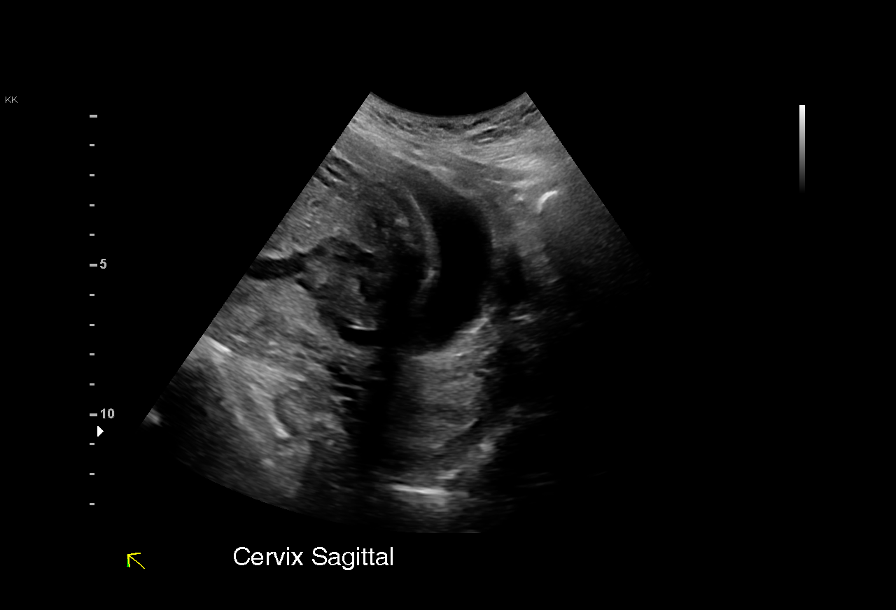
[im 4/9]
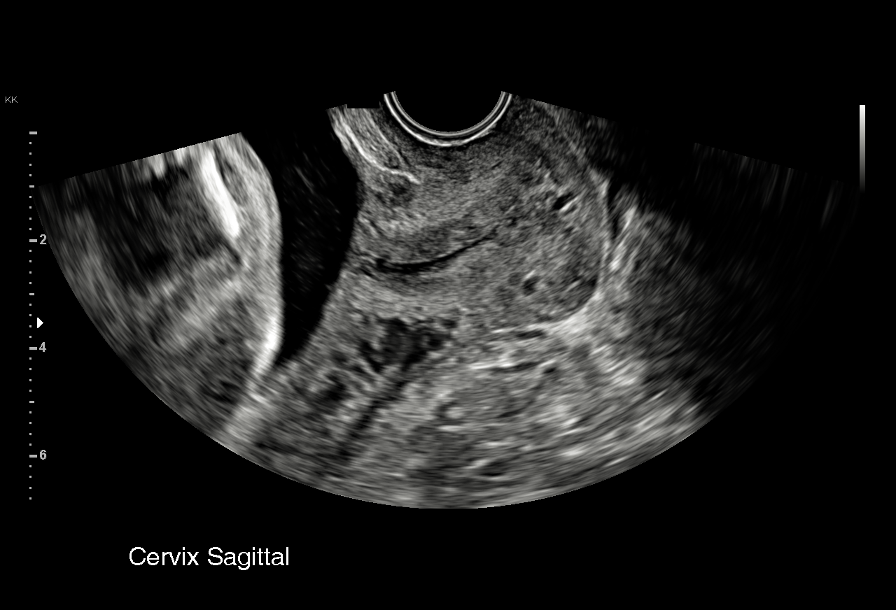
[im 5/9]
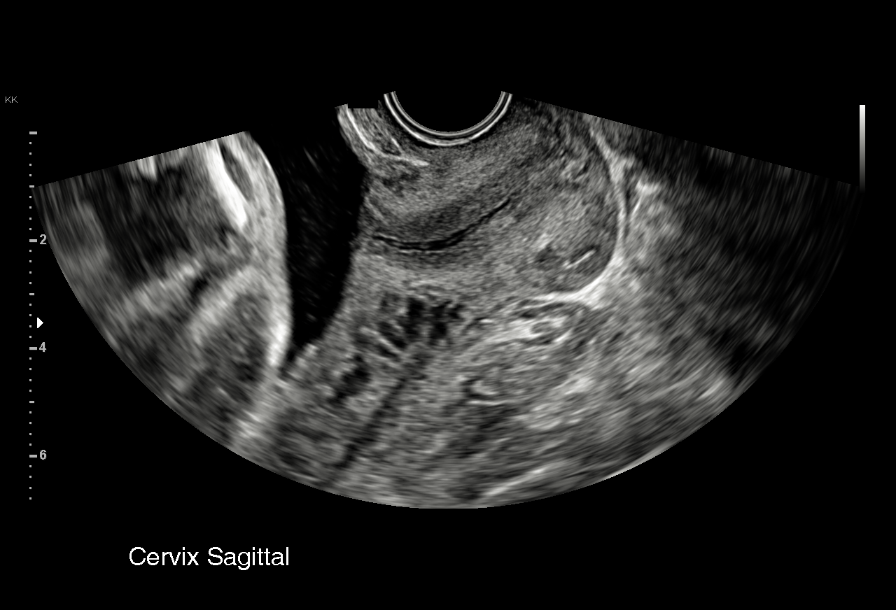
[im 6/9]
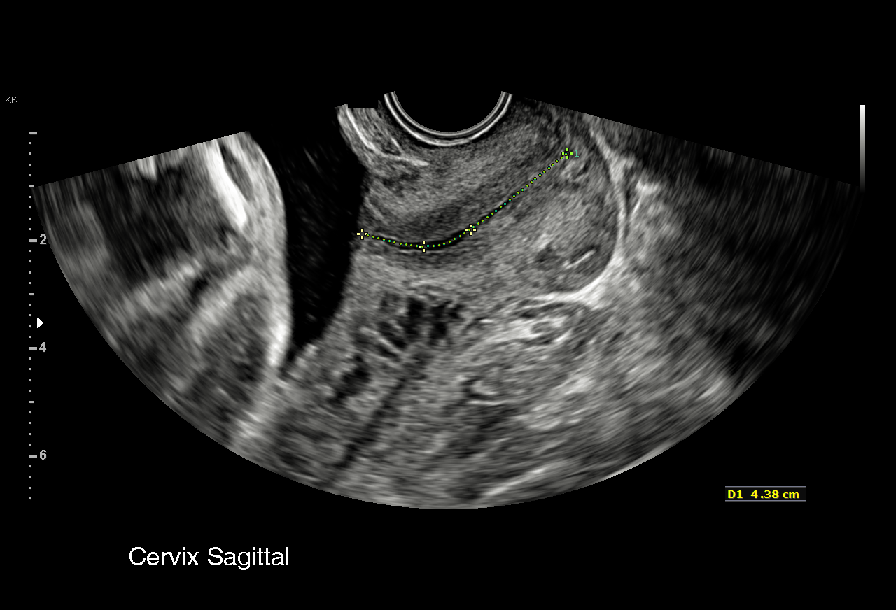
[im 7/9]
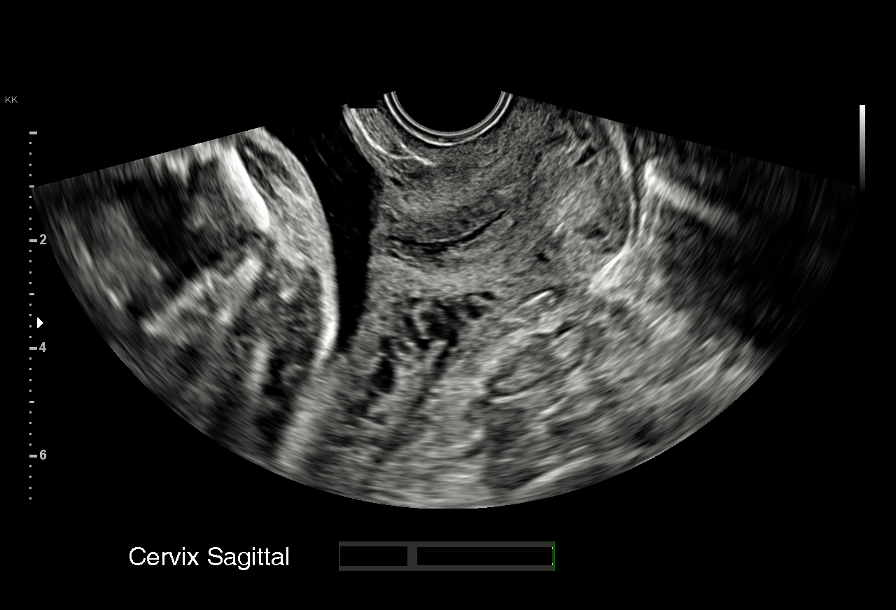
[im 8/9]
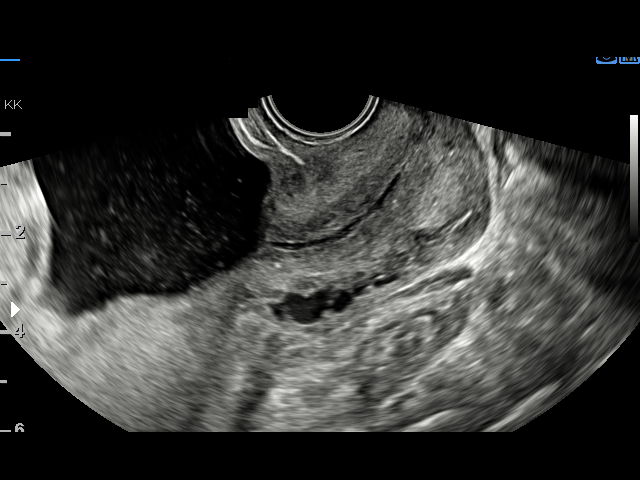
[im 9/9]
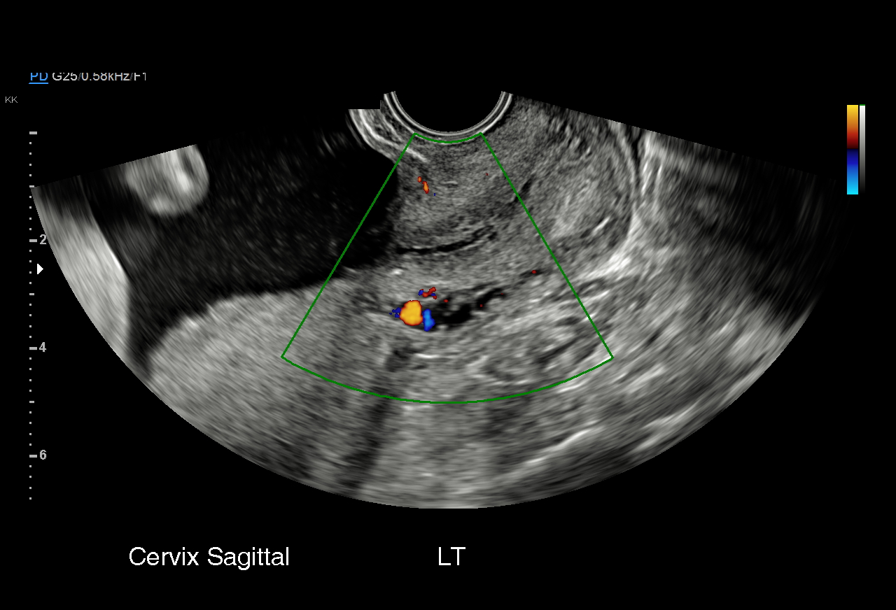

[9 of 9 positions shown; findings below may reference images not displayed]

[REDACTED]

1  JONGUY          [id number]      [id number]     [id number]
Indications

22 weeks gestation of pregnancy
OB History

Blood Type:            Height:  5'3"   Weight (lb):  152      BMI:
Gravidity:    4         Prem:   2         SAB:   1
Living:       1
Fetal Evaluation

Num Of Fetuses:     1
Fetal Heart         148
Rate(bpm):
Cardiac Activity:   Observed
Presentation:       Breech
Gestational Age

Best:          22w 4d    Det. By:   U/S  (09/07/16)          EDD:   01/21/17
Cervix Uterus Adnexa

Cervix
Length:            4.4  cm.
Measured transvaginally.
Impression

IUP at 22+4 weeks with history of preterm delivery. Here for
TV cervical length
Normal fetal cardiac activity and movement
Cervix 44 mm in length wigh no funnelling and no changes
with transfundal pressure
Recommendations

Repeat TV cervical length in 2 weeks

## 2019-11-13 ENCOUNTER — Emergency Department (HOSPITAL_COMMUNITY)
Admission: EM | Admit: 2019-11-13 | Discharge: 2019-11-15 | Disposition: A | Discharge status: Home / Self Care | Payer: Self-pay | Attending: Emergency Medicine | Admitting: Emergency Medicine

## 2019-11-13 ENCOUNTER — Encounter (HOSPITAL_COMMUNITY): Payer: Self-pay | Admitting: Emergency Medicine

## 2019-11-13 ENCOUNTER — Other Ambulatory Visit: Payer: Self-pay

## 2019-11-13 DIAGNOSIS — F4329 Adjustment disorder with other symptoms: Secondary | ICD-10-CM

## 2019-11-13 DIAGNOSIS — Z79899 Other long term (current) drug therapy: Secondary | ICD-10-CM | POA: Insufficient documentation

## 2019-11-13 DIAGNOSIS — F332 Major depressive disorder, recurrent severe without psychotic features: Secondary | ICD-10-CM | POA: Insufficient documentation

## 2019-11-13 DIAGNOSIS — F32A Depression, unspecified: Secondary | ICD-10-CM

## 2019-11-13 DIAGNOSIS — Z87891 Personal history of nicotine dependence: Secondary | ICD-10-CM | POA: Insufficient documentation

## 2019-11-13 DIAGNOSIS — Z046 Encounter for general psychiatric examination, requested by authority: Secondary | ICD-10-CM

## 2019-11-13 DIAGNOSIS — Z20822 Contact with and (suspected) exposure to covid-19: Secondary | ICD-10-CM | POA: Insufficient documentation

## 2019-11-13 LAB — COMPREHENSIVE METABOLIC PANEL
ALT: 12 U/L (ref 0–44)
AST: 18 U/L (ref 15–41)
Albumin: 4.4 g/dL (ref 3.5–5.0)
Alkaline Phosphatase: 58 U/L (ref 38–126)
Anion gap: 10 (ref 5–15)
BUN: 12 mg/dL (ref 6–20)
CO2: 21 mmol/L — ABNORMAL LOW (ref 22–32)
Calcium: 9.1 mg/dL (ref 8.9–10.3)
Chloride: 103 mmol/L (ref 98–111)
Creatinine, Ser: 0.68 mg/dL (ref 0.44–1.00)
GFR calc Af Amer: >60 mL/min (ref >60)
GFR calc non Af Amer: >60 mL/min (ref >60)
Glucose, Bld: 83 mg/dL (ref 70–99)
Potassium: 3.7 mmol/L (ref 3.5–5.1)
Sodium: 134 mmol/L — ABNORMAL LOW (ref 135–145)
Total Bilirubin: 0.9 mg/dL (ref 0.3–1.2)
Total Protein: 7.2 g/dL (ref 6.5–8.1)

## 2019-11-13 LAB — CBC
HCT: 36.7 % (ref 36.0–46.0)
Hemoglobin: 12.5 g/dL (ref 12.0–15.0)
MCH: 31.1 pg (ref 26.0–34.0)
MCHC: 34.1 g/dL (ref 30.0–36.0)
MCV: 91.3 fL (ref 80.0–100.0)
Platelets: 226 10*3/uL (ref 150–400)
RBC: 4.02 MIL/uL (ref 3.87–5.11)
RDW: 12.3 % (ref 11.5–15.5)
WBC: 6.4 10*3/uL (ref 4.0–10.5)
nRBC: 0 % (ref 0.0–0.2)

## 2019-11-13 LAB — RAPID URINE DRUG SCREEN, HOSP PERFORMED
Amphetamines: NOT DETECTED
Barbiturates: NOT DETECTED
Benzodiazepines: NOT DETECTED
Cocaine: NOT DETECTED
Opiates: NOT DETECTED
Tetrahydrocannabinol: NOT DETECTED

## 2019-11-13 LAB — I-STAT BETA HCG BLOOD, ED (MC, WL, AP ONLY): I-stat hCG, quantitative: <5 m[IU]/mL (ref <5)

## 2019-11-13 LAB — ETHANOL: Alcohol, Ethyl (B): <10 mg/dL (ref <10)

## 2019-11-13 LAB — ACETAMINOPHEN LEVEL: Acetaminophen (Tylenol), Serum: <10 ug/mL — ABNORMAL LOW (ref 10–30)

## 2019-11-13 LAB — SALICYLATE LEVEL: Salicylate Lvl: <7 mg/dL — ABNORMAL LOW (ref 7.0–30.0)
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
## 2019-11-14 LAB — SARS CORONAVIRUS 2 BY RT PCR (HOSPITAL ORDER, PERFORMED IN ~~LOC~~ HOSPITAL LAB): SARS Coronavirus 2: NEGATIVE

## 2019-11-14 MED ORDER — FLUOXETINE HCL 20 MG PO CAPS
20.0000 mg | ORAL_CAPSULE | Freq: Every day | ORAL | Status: DC
  Administered 2019-11-15: 20 mg via ORAL
  Filled 2019-11-14 (×2): qty 1

## 2019-11-14 NOTE — ED Notes (Signed)
IVC Breakfast Ordered 

## 2019-11-14 NOTE — ED Notes (Signed)
[facility] advised this RN that husband to be contacted as well as [facility] NP for treatment plan. Will advise this RN when plan is in place.

## 2019-11-14 NOTE — BH Assessment (Signed)
Per Arvilla Market NP patient continues to meet inpatient criteria and CSW Algis Greenhouse has referred out. See Epic note
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
## 2019-11-14 NOTE — Progress Notes (Signed)
Patient meets criteria for inpatient treatment. No appropriate or available beds at [facility]. CSW faxed referrals to the following facilities for review:  [facility]   TTS will continue to seek bed placement.  Starasia Sinko R. Rorik Vespa, MSW, LCSW Clinical Social Work/Disposition Phone: [phone] Fax: [phone]    
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
## 2019-11-14 NOTE — ED Notes (Signed)
Lunch was ordered by Anita [name] 

## 2019-11-14 NOTE — ED Notes (Signed)
TTS in progress 
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
## 2019-11-14 NOTE — ED Notes (Signed)
Report given to Christell Constant., RN

## 2019-11-14 NOTE — Progress Notes (Signed)
CSW was notified [facility] declined as they are at capacity.  TTS will continue to seek placement.   Fredric Slabach R. Yahira Timberman, MSW, LCSW Clinical Social Work/Disposition Phone: [phone] Fax: [phone]  

## 2019-11-14 NOTE — BH Assessment (Signed)
Comprehensive Clinical Assessment (CCA) Screening, Triage and Referral Note  11/14/2019 Anita [name] [id number]  Pt is a 29 year old married female who presents unaccompanied to [facility] ED via law enforcement after being petitioned for involuntary commitment by Patent examiner. Affidavit and petition states Pt attempted to kill herself by jumping from a moving vehicle and running into traffic.  Pt describes a history of depressive episodes that began ten years ago following the death of her first child. She says she also has experienced postpartum depression and attended group therapy in the past. She says today she was feeling sad and doesn't know why. She says she and her husband were returning home from work and she didn't want to go home because she didn't want her children to see she was upset. She says she "wanted to just get away and have some time to myself." She says she started getting out of the car while it was moving but her husband stopped the car. She said she ran away and her husband came after her. She says he was trying to get her to come back into the car and she was screaming because she didn't want to. She says bystanders witnessed the incident and called law enforcement. She said law enforcement arrived at their home and said she had to come to the hospital.  Pt denies suicidal ideation and says she never had intent to harm herself. She denies any history of suicide attempts. She acknowledges depressive symptoms including crying spells and social withdrawal. Pt denies any history of intentional self-injurious behaviors. Pt denies current homicidal ideation or history of violence. Pt denies any history of auditory or visual hallucinations. Pt denies history of alcohol or other substance use.  Pt reports she lives with her husband, 80-year-old daughter, and 70-year-old son. She identifies her husband as her only support but says their relationship is at times strained. 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 She says she moved to the [place] from [place] when she was age 89 and learning English and transition to a new country was difficult. She denies history of abuse. She reports her sister has been treated for depression. Pt denies legal problems. She denies access to firearms. She report she has received outpatient therapy in the past and recently contacted a therapist but had not received a call back. She denies any history of inpatient psychiatric treatment.   TTS contacted Pt's husband, Ardeth Sportsman [phone], via telephone Spanish interpreter. He corroborates Pt's description of events. He says he doesn't know why she was upset today but she has had episodes in the past where she would run away and then return. He states she made no indication to him that she was trying to intentionally harm herself. He says he has no concerns for her safety and would like for her to be discharged as soon as possible because they need to work.  Pt is dressed in hospital scrubs, alert and oriented x4. Pt speaks in a clear tone, at moderate volume and normal pace. Motor behavior appears normal. Eye contact is good and Pt was at times tearful. Pt's mood is depressed and affect is congruent with mood. Thought process is coherent and relevant. There is no indication Pt is currently responding to internal stimuli or experiencing delusional thought content. Pt was cooperative throughout assessment. She says she would like to resume outpatient therapy.   Visit Diagnosis: No diagnosis found.  F33.2 Major depressive disorder, Recurrent episode, Severe  DISPOSITION: Gave  clinical report to Lindon Romp, FNP who recommended inpatient psychiatric treatment. Lavell Luster, [facility] at [facility], confirmed adult unit is at capacity. TTS will contact facilities for placement. Notified Charlann Lange, PA-C and Cyd Silence, RN of recommendation.  PHQ9 SCORE ONLY 11/14/2019 03/29/2017  PHQ-9 Total  Score 1 0    Patient Reported Information How did you hear about [place]? Other (Comment)   Referral name: Law enforcement   Referral phone number: [phone]  Whom do you see for routine medical problems? I don't have a doctor   Practice/Facility Name: No data recorded  Practice/Facility Phone Number: No data recorded  Name of Contact: No data recorded  Contact Number: No data recorded  Contact Fax Number: No data recorded  Prescriber Name: No data recorded  Prescriber Address (if known): No data recorded What Is the Reason for Your Visit/Call Today? Pt petitioned for IVC by law enforcement due to jumping from a moving car and running into traffic  How Long Has This Been Causing You Problems? > than 6 months  Have You Recently Been in Any Inpatient Treatment (Hospital/Detox/Crisis Center/28-Day Program)? No   Name/Location of Program/Hospital:No data recorded  How Long Were You There? No data recorded  When Were You Discharged? No data recorded Have You Ever Received Services From [facility] Before? Yes   Who Do You See at [facility]? OBGYN  Have You Recently Had Any Thoughts About Hurting Yourself? No   Are You Planning to Commit Suicide/Harm Yourself At This time?  No  Have you Recently Had Thoughts About [place]? No   Explanation: No data recorded Have You Used Any Alcohol or Drugs in the Past 24 Hours? No   How Long Ago Did You Use Drugs or Alcohol?  No data recorded  What Did You Use and How Much? No data recorded What Do You Feel Would Help You the Most Today? Therapy  Do You Currently Have a Therapist/Psychiatrist? No   Name of Therapist/Psychiatrist: No data recorded  Have You Been Recently Discharged From Any Office Practice or Programs? No   Explanation of Discharge From Practice/Program:  No data recorded    CCA Screening Triage Referral Assessment Type of Contact: Tele-Assessment   Is this Initial or Reassessment? Initial  Assessment   Date Telepsych consult ordered in [facility]:  11/14/19   Time Telepsych consult ordered in [facility]:  0400  Patient Reported Information Reviewed? Yes   Patient Left Without Being Seen? No data recorded  Reason for Not Completing Assessment: No data recorded Collateral Involvement: Pt's husband: Gilda Crease [phone]  Does Patient Have a Court Appointed Legal Guardian? No data recorded  Name and Contact of Legal Guardian:  No data recorded If Minor and Not Living with Parent(s), Who has Custody? No data recorded Is CPS involved or ever been involved? Never  Is APS involved or ever been involved? Never  Patient Determined To Be At Risk for Harm To Self or Others Based on Review of Patient Reported Information or Presenting Complaint? Yes, for Self-Harm   Method: No data recorded  Availability of Means: No data recorded  Intent: No data recorded  Notification Required: No data recorded  Additional Information for Danger to Others Potential:  No data recorded  Additional Comments for Danger to Others Potential:  No data recorded  Are There Guns or Other Weapons in Your Home?  No data recorded   Types of Guns/Weapons: No data recorded   Are These Weapons Safely Secured?  No data recorded   Who Could Verify You Are Able To Have These Secured:    No data recorded Do You Have any Outstanding Charges, Pending Court Dates, Parole/Probation? No data recorded Contacted To Inform of Risk of Harm To Self or Others: Law Enforcement  Location of Assessment: [facility] ED  Does Patient Present under Involuntary Commitment? Yes   IVC Papers Initial File Date: 11/13/19   [place] of Residence: No data recorded Patient Currently Receiving the Following Services: Not Receiving Services   Determination of Need: Emergent (2 hours)   Options For Referral: Inpatient Hospitalization   Pamalee Leyden, [facility] 352

## 2019-11-14 NOTE — ED Notes (Signed)
Per Ala Dach at [facility], pt to receive inpt tx, denies available bed currently; to place pt in appropriate facility when bed available. PA Melvenia Beam made aware.

## 2019-11-15 DIAGNOSIS — F4329 Adjustment disorder with other symptoms: Secondary | ICD-10-CM

## 2019-11-15 NOTE — ED Notes (Signed)
Breakfast Ordered 

## 2019-11-15 NOTE — ED Notes (Signed)
Patients phone given her brother, Eduardo Osier, per her reqeust.

## 2019-11-15 NOTE — BHH Suicide Risk Assessment (Cosign Needed Addendum)
Suicide Risk Assessment  Discharge Assessment   [facility] Discharge Suicide Risk Assessment   Principal Problem: Adjustment disorder with disturbance of emotion Discharge Diagnoses: Principal Problem:   Adjustment disorder with disturbance of emotion  Patient location: [place]  Provider location: [place], 29 y.o., female patient presented to ED IVC for evaluation of suicidal behaviors.  Patient seen via telepsych by this provider; chart reviewed and consulted with Dr. Dwyane Dee on 11/15/19.  On evaluation Anita [name] reports  " I do not want to hurt myself. I never wanted to hurt myself." Patient states she began feeling overwhelmed while driving in the car with her husband and 2 children.  Apparently she was triggered by a private conversation with her husband regarding work Paramedic.  States, when the car came to a stop, she told him she needed to get out of the car to get some air.  Patient states she did not want her children to see her emotionally dysregulated.  States somewhere during the course of her getting out of the car and walking away, the police were called and she was sent to the hospital IVC.   She reports a history for depression but denies previous suicidal behaviors, inpatient hospitalizations or antidepressant treatment.  In the past, she states she has been able to manage stress and depression with outpatient therapy and did not want to start antidepressant medication.  She states she lives with her husband and 2 children, ages 22 and 66, all of whom she lists as protective factors. States he spouse is very supportive.  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    She was started on prozac 20mg  last night and tolerated well without GI disturbances.  She is interested in continuing medication therapy at this time.    Total Time spent with patient: 30  minutes  Musculoskeletal: Strength & Muscle Tone: within normal limits Gait & Station: normal Patient leans: N/A  Psychiatric Specialty Exam: @ROSBYAGE @  Blood pressure (!) 112/58, pulse 70, temperature 97.7 F (36.5 C), temperature source Oral, resp. rate 16, SpO2 100 %, unknown if currently breastfeeding.There is no height or weight on file to calculate BMI.  General Appearance: Casual, Well Groomed and hair is neatly combed   Eye Contact::  Good  Speech:  Clear and Coherent and Normal Rate409  Volume:  Normal  Mood:  Euthymic  Affect:  Congruent and patient smiles and laughs appropriately during assessment  Thought Process:  Coherent and Descriptions of Associations: Intact  Orientation:  Full (Time, Place, and Person)  Thought Content:  Logical, Rumination and some rumination on getting home to care for her kids and her business  Suicidal Thoughts:  No  Homicidal Thoughts:  No  Memory:  Immediate;   Good Recent;   Good Remote;   Good  Judgement:  Good  Insight:  Good  Psychomotor Activity:  Normal  Concentration:  Good  Recall:  Good  Fund of Knowledge:Good  Language: Good  Akathisia:  NA  Handed:  Right  AIMS (if indicated):     Assets:  Communication Skills Desire for Improvement Housing Social Support Vocational/Educational  Sleep:     Cognition: WNL  ADL's:  Intact   Mental Status Per Nursing Assessment::   On Admission:   Admitted for evaluation of suicidal behaviors  Demographic Factors:  NA  Loss Factors: NA  Historical Factors: NA  Risk Reduction Factors:  Sense of responsibility to family, Living with another person, especially a relative, Positive social support and Positive coping skills or problem solving skills  Continued Clinical Symptoms:  Previous Psychiatric Diagnoses and Treatments history of depression with therapy  Cognitive Features That Contribute To Risk:  None    Suicide Risk:  Minimal: No identifiable suicidal ideation.   Patients presenting with no risk factors but with morbid ruminations; may be classified as minimal risk based on the severity of the depressive symptoms    Plan Of Care/Follow-up recommendations:  As per above assessment, the patient does not meet inpatient criteria at this time.  The patient appears reasonably screened and/or stabilized for discharge and does not appear to have emergency psychiatric concerns/conditions requiring further screening, evaluation, or treatment at this time prior to discharge.  As discussed with the patient, she did well on prozac last night, she agrees to continue to take this medication.  Will call Prozac 20mg  daily to [facility] on [facility] per patient request  She is future oriented contracts for safety and requests to follow-up with outpatient mental health resources for therapy and medication management.  SW/TTS will fax above to patient prior to discharge.     Take all of you medications as prescribed by your mental healthcare provider.  Report any adverse effects and reactions from your medications to your outpatient provider promptly.  Do not engage in alcohol and or illegal drug use while on prescription medicines. Keep all scheduled appointments. This is to ensure that you are getting refills on time and to avoid any interruption in your medication.  If you are unable to keep an appointment call to reschedule.  Be sure to follow up with resources and follow ups given. In the event of worsening symptoms call the crisis hotline, 911, and or go to the nearest emergency department for appropriate evaluation and treatment of symptoms. Follow-up with your primary care provider for your medical issues, concerns and or health care needs.    Spoke with [facility]. [facility], EDP, who is informed of above recommendation and disposition who agrees to rescind the IVC   Particia Nearing, PMHNP  Ophelia Shoulder, NP 11/15/2019, 10:52 AM

## 2019-11-15 NOTE — ED Notes (Signed)
Pt resting comfortably, sitter at the bedside

## 2019-11-15 NOTE — ED Notes (Signed)
Patient Alert and oriented to baseline. Stable and ambulatory to baseline. Patient verbalized understanding of the discharge instructions.  Patient belongings were taken by the patient.   

## 2019-11-15 NOTE — ED Provider Notes (Signed)
Emergency Medicine Observation Re-evaluation Note  Anita [name] is a 29 y.o. female, seen on rounds today.  Pt initially presented to the ED for complaints of Depression Currently, the patient is resting in hallway bed.  She has been reassessed by psych this morning, plan for discharge and IVC will be rescinded  Physical Exam  BP (!) 112/58 (BP Location: Right Arm)   Pulse 70   Temp 97.7 F (36.5 C) (Oral)   Resp 16   SpO2 100%  Physical Exam Vitals and nursing note reviewed.  Constitutional:      General: She is not in acute distress.    Appearance: She is well-developed.  HENT:     Head: Normocephalic and atraumatic.  Eyes:     General:        Right eye: No discharge.        Left eye: No discharge.     Conjunctiva/sclera: Conjunctivae normal.  Neck:     Vascular: No JVD.     Trachea: No tracheal deviation.  Cardiovascular:     Rate and Rhythm: Normal rate.  Pulmonary:     Effort: Pulmonary effort is normal.  Abdominal:     General: There is no distension.  Skin:    Findings: No erythema.  Neurological:     Mental Status: She is alert.  Psychiatric:        Behavior: Behavior normal.     ED Course / MDM  EKG:    I have reviewed the labs performed to date as well as medications administered while in observation.  Recent changes in the last 24 hours include none. Plan  Current plan is for discharge home with outpatient follow-up Patient is under full IVC at this time, but this will be rescinded.   Bennye Alm 11/15/19 1132    Jacalyn Lefevre, MD 11/15/19 1214

## 2020-12-12 ENCOUNTER — Inpatient Hospital Stay (HOSPITAL_BASED_OUTPATIENT_CLINIC_OR_DEPARTMENT_OTHER)
Admission: EM | Admit: 2020-12-12 | Discharge: 2020-12-15 | DRG: 340 | Disposition: A | Discharge status: Home / Self Care | Payer: Self-pay | Attending: Surgery | Admitting: Surgery

## 2020-12-12 ENCOUNTER — Emergency Department (HOSPITAL_COMMUNITY): Admission: EM | Admit: 2020-12-12 | Discharge: 2020-12-12 | Discharge status: Against Medical Advice | Payer: Self-pay

## 2020-12-12 ENCOUNTER — Encounter (HOSPITAL_BASED_OUTPATIENT_CLINIC_OR_DEPARTMENT_OTHER): Payer: Self-pay | Admitting: *Deleted

## 2020-12-12 ENCOUNTER — Other Ambulatory Visit: Payer: Self-pay

## 2020-12-12 ENCOUNTER — Emergency Department (HOSPITAL_BASED_OUTPATIENT_CLINIC_OR_DEPARTMENT_OTHER): Payer: Self-pay

## 2020-12-12 DIAGNOSIS — K353 Acute appendicitis with localized peritonitis, without perforation or gangrene: Secondary | ICD-10-CM

## 2020-12-12 DIAGNOSIS — K3532 Acute appendicitis with perforation and localized peritonitis, without abscess: Principal | ICD-10-CM | POA: Diagnosis present

## 2020-12-12 DIAGNOSIS — Z9851 Tubal ligation status: Secondary | ICD-10-CM

## 2020-12-12 DIAGNOSIS — K3580 Unspecified acute appendicitis: Secondary | ICD-10-CM | POA: Diagnosis present

## 2020-12-12 DIAGNOSIS — Z87891 Personal history of nicotine dependence: Secondary | ICD-10-CM

## 2020-12-12 DIAGNOSIS — Z20822 Contact with and (suspected) exposure to covid-19: Secondary | ICD-10-CM | POA: Diagnosis present

## 2020-12-12 LAB — URINALYSIS, ROUTINE W REFLEX MICROSCOPIC
Bilirubin Urine: NEGATIVE
Glucose, UA: 500 mg/dL — AB
Leukocytes,Ua: NEGATIVE
Nitrite: NEGATIVE
Protein, ur: 30 mg/dL — AB
Specific Gravity, Urine: 1.035 — ABNORMAL HIGH (ref 1.005–1.030)
pH: 6 (ref 5.0–8.0)

## 2020-12-12 LAB — CBC
HCT: 37.8 % (ref 36.0–46.0)
Hemoglobin: 13.2 g/dL (ref 12.0–15.0)
MCH: 30.9 pg (ref 26.0–34.0)
MCHC: 34.9 g/dL (ref 30.0–36.0)
MCV: 88.5 fL (ref 80.0–100.0)
Platelets: 233 10*3/uL (ref 150–400)
RBC: 4.27 MIL/uL (ref 3.87–5.11)
RDW: 13 % (ref 11.5–15.5)
WBC: 12.5 10*3/uL — ABNORMAL HIGH (ref 4.0–10.5)
nRBC: 0 % (ref 0.0–0.2)

## 2020-12-12 LAB — RESP PANEL BY RT-PCR (FLU A&B, COVID) ARPGX2
Influenza A by PCR: NEGATIVE
Influenza B by PCR: NEGATIVE
SARS Coronavirus 2 by RT PCR: NEGATIVE

## 2020-12-12 LAB — LIPASE, BLOOD: Lipase: <10 U/L — ABNORMAL LOW (ref 11–51)

## 2020-12-12 LAB — COMPREHENSIVE METABOLIC PANEL
ALT: 9 U/L (ref 0–44)
AST: 11 U/L — ABNORMAL LOW (ref 15–41)
Albumin: 4.5 g/dL (ref 3.5–5.0)
Alkaline Phosphatase: 56 U/L (ref 38–126)
Anion gap: 12 (ref 5–15)
BUN: 10 mg/dL (ref 6–20)
CO2: 22 mmol/L (ref 22–32)
Calcium: 9.2 mg/dL (ref 8.9–10.3)
Chloride: 103 mmol/L (ref 98–111)
Creatinine, Ser: 0.65 mg/dL (ref 0.44–1.00)
GFR, Estimated: >60 mL/min (ref >60)
Glucose, Bld: 148 mg/dL — ABNORMAL HIGH (ref 70–99)
Potassium: 3.4 mmol/L — ABNORMAL LOW (ref 3.5–5.1)
Sodium: 137 mmol/L (ref 135–145)
Total Bilirubin: 1.4 mg/dL — ABNORMAL HIGH (ref 0.3–1.2)
Total Protein: 7.7 g/dL (ref 6.5–8.1)

## 2020-12-12 LAB — PREGNANCY, URINE: Preg Test, Ur: NEGATIVE

## 2020-12-12 MED ORDER — IOHEXOL 350 MG/ML SOLN
75.0000 mL | Freq: Once | INTRAVENOUS | Status: AC | PRN
  Administered 2020-12-12: 75 mL via INTRAVENOUS

## 2020-12-12 MED ORDER — LACTATED RINGERS IV BOLUS
1000.0000 mL | Freq: Once | INTRAVENOUS | Status: AC
  Administered 2020-12-12: 1000 mL via INTRAVENOUS

## 2020-12-12 MED ORDER — METRONIDAZOLE 500 MG/100ML IV SOLN
500.0000 mg | Freq: Once | INTRAVENOUS | Status: AC
  Administered 2020-12-12: 500 mg via INTRAVENOUS
  Filled 2020-12-12: qty 100

## 2020-12-12 MED ORDER — MORPHINE SULFATE (PF) 4 MG/ML IV SOLN
4.0000 mg | Freq: Once | INTRAVENOUS | Status: AC
  Administered 2020-12-12: 4 mg via INTRAVENOUS
  Filled 2020-12-12: qty 1

## 2020-12-12 MED ORDER — FENTANYL CITRATE (PF) 100 MCG/2ML IJ SOLN
50.0000 ug | Freq: Once | INTRAMUSCULAR | Status: AC
  Administered 2020-12-12: 50 ug via INTRAVENOUS
  Filled 2020-12-12: qty 2

## 2020-12-12 MED ORDER — SODIUM CHLORIDE 0.9 % IV SOLN
1.0000 g | Freq: Once | INTRAVENOUS | Status: AC
  Administered 2020-12-12: 1 g via INTRAVENOUS
  Filled 2020-12-12: qty 10

## 2020-12-12 MED ORDER — ONDANSETRON HCL 4 MG/2ML IJ SOLN
4.0000 mg | Freq: Once | INTRAMUSCULAR | Status: AC
  Administered 2020-12-12: 4 mg via INTRAVENOUS
  Filled 2020-12-12: qty 2

## 2020-12-12 NOTE — ED Triage Notes (Signed)
Right lower quadrant pain since last night, and vomiting when she eats and burning sensation from her epigastric area down to her lower abdomen.

## 2020-12-12 NOTE — ED Provider Notes (Signed)
[facility] EMERGENCY DEPARTMENT Provider Note  CSN: [id number] Arrival date & time: 12/12/20 1600    History Chief Complaint  Patient presents with   Abdominal Pain   Emesis    Anita [name] is a 30 y.o. female reports she has had 3 days of worsening lower abdominal pain, R>L, associated with nausea, anorexia and then today some vomiting. She has not had a fever. She reports she has had similar pain off and on for the last several months but usually goes away after a day. This time it has lingered longer. She has felt constipated as well, has been trying suppositories without improvement.    Past Medical History:  Diagnosis Date   Depression    takes no meds    Past Surgical History:  Procedure Laterality Date   CERVICAL CERCLAGE  07/03/2011   Procedure: CERCLAGE CERVICAL;  Surgeon: Kathreen Cosier, MD;  Location: [facility] ORS;  Service: Gynecology;  Laterality: N/A;   CESAREAN SECTION  2010   23month fetal demise.Marland Kitchen    CESAREAN SECTION  12/04/2011   Procedure: CESAREAN SECTION;  Surgeon: Kathreen Cosier, MD;  Location: [facility] ORS;  Service: Gynecology;  Laterality: N/A;   CESAREAN SECTION WITH BILATERAL TUBAL LIGATION Bilateral 12/31/2016   Procedure: REPEAT CESAREAN SECTION;  Surgeon: Willodean Rosenthal, MD;  Location: [facility] BIRTHING SUITES;  Service: Obstetrics;  Laterality: Bilateral;    History reviewed. No pertinent family history.  Social History   Tobacco Use   Smoking status: Former    Types: Cigarettes    Quit date: 07/02/2010    Years since quitting: 10.4   Smokeless tobacco: Never  Substance Use Topics   Alcohol use: No   Drug use: No     Home Medications Prior to Admission medications   Not on File     Allergies    Patient has no known allergies.   Review of Systems   Review of Systems A comprehensive review of systems was completed and negative except as noted in HPI.    Physical Exam BP 111/70   Pulse (!) 101   Temp  99.1 F (37.3 C)   Resp 16   Ht 5\' 3"  (1.6 m)   Wt 67.1 kg   LMP 11/12/2020   SpO2 100%   BMI 26.22 kg/m   Physical Exam Vitals and nursing note reviewed.  Constitutional:      Appearance: Normal appearance.  HENT:     Head: Normocephalic and atraumatic.     Nose: Nose normal.     Mouth/Throat:     Mouth: Mucous membranes are moist.  Eyes:     Extraocular Movements: Extraocular movements intact.     Conjunctiva/sclera: Conjunctivae normal.  Cardiovascular:     Rate and Rhythm: Normal rate.  Pulmonary:     Effort: Pulmonary effort is normal.     Breath sounds: Normal breath sounds.  Abdominal:     General: Abdomen is flat.     Palpations: Abdomen is soft.     Tenderness: There is abdominal tenderness in the right lower quadrant and left lower quadrant. There is guarding. Positive signs include Rovsing's sign and McBurney's sign. Negative signs include Murphy's sign.  Musculoskeletal:        General: No swelling. Normal range of motion.     Cervical back: Neck supple.  Skin:    General: Skin is warm and dry.  Neurological:     General: No focal deficit present.     Mental Status: She is  alert.  Psychiatric:        Mood and Affect: Mood normal.     ED Results / Procedures / Treatments   Labs (all labs ordered are listed, but only abnormal results are displayed) Labs Reviewed  LIPASE, BLOOD - Abnormal; Notable for the following components:      Result Value   Lipase <10 (*)    All other components within normal limits  COMPREHENSIVE METABOLIC PANEL - Abnormal; Notable for the following components:   Potassium 3.4 (*)    Glucose, Bld 148 (*)    AST 11 (*)    Total Bilirubin 1.4 (*)    All other components within normal limits  CBC - Abnormal; Notable for the following components:   WBC 12.5 (*)    All other components within normal limits  URINALYSIS, ROUTINE W REFLEX MICROSCOPIC - Abnormal; Notable for the following components:   APPearance CLOUDY (*)     Specific Gravity, Urine 1.035 (*)    Glucose, UA 500 (*)    Hgb urine dipstick TRACE (*)    Ketones, ur TRACE (*)    Protein, ur 30 (*)    All other components within normal limits  RESP PANEL BY RT-PCR (FLU A&B, COVID) ARPGX2  PREGNANCY, URINE    EKG None   Radiology CT Abdomen Pelvis W Contrast  Result Date: 12/12/2020 CLINICAL DATA:  RLQ abdominal pain, appendicitis suspected (Age >= 14y) EXAM: CT ABDOMEN AND PELVIS WITH CONTRAST TECHNIQUE: Multidetector CT imaging of the abdomen and pelvis was performed using the standard protocol following bolus administration of intravenous contrast. CONTRAST:  41mL OMNIPAQUE IOHEXOL 350 MG/ML SOLN COMPARISON:  None. FINDINGS: Lower chest: No acute abnormality. Hepatobiliary: No focal liver abnormality is seen. No gallstones, gallbladder wall thickening, or biliary dilatation. Pancreas: Unremarkable. No pancreatic ductal dilatation or surrounding inflammatory changes. Spleen: Normal in size without focal abnormality. Adrenals/Urinary Tract: Adrenal glands are unremarkable. No hydronephrosis. Punctate nonobstructive right renal stone in the mid kidney. Bladder is unremarkable. Stomach/Bowel: The stomach is within normal limits. There is no evidence of bowel obstruction. The appendix is dilated with periappendiceal fat stranding and hyperenhancing appendiceal wall. Vascular/Lymphatic: No significant vascular findings are present. Reproductive: Corpus luteal cyst on the right ovary. Arcuate uterine morphology. Other: No abdominal hernia.  Small volume pelvic free fluid. Musculoskeletal: No acute or significant osseous findings. IMPRESSION: Acute appendicitis. Small volume pelvic free fluid. No rim enhancing abscess. Appendix: Location: Pelvis Diameter: 1.0 cm Appendicolith: None Mucosal hyper-enhancement: Yes Extraluminal gas: None Periappendiceal collection: No Electronically Signed   By: Caprice Renshaw   On: 12/12/2020 21:19     Procedures Procedures  Medications Ordered in the ED Medications  metroNIDAZOLE (FLAGYL) IVPB 500 mg (has no administration in time range)  morphine 4 MG/ML injection 4 mg (has no administration in time range)  ondansetron (ZOFRAN) injection 4 mg (has no administration in time range)  lactated ringers bolus 1,000 mL (1,000 mLs Intravenous New Bag/Given 12/12/20 2101)  fentaNYL (SUBLIMAZE) injection 50 mcg (50 mcg Intravenous Given 12/12/20 2059)  iohexol (OMNIPAQUE) 350 MG/ML injection 75 mL (75 mLs Intravenous Contrast Given 12/12/20 2103)  cefTRIAXone (ROCEPHIN) 1 g in sodium chloride 0.9 % 100 mL IVPB (1 g Intravenous New Bag/Given 12/12/20 2143)     MDM Rules/Calculators/A&P MDM Patient with lower abdominal pain, tenderness and guarding on exam. Pain is worse on the right, concerning for appendicitis. WBC is mildly elevated. Will send for CT. Pain meds and fluids for comfort. Patient declines nausea meds for  now.   ED Course  I have reviewed the triage vital signs and the nursing notes.  Pertinent labs & imaging results that were available during my care of the patient were reviewed by me and considered in my medical decision making (see chart for details).  Clinical Course as of 12/12/20 2216  Mon Dec 12, 2020  2126 CT shows acute appendicitis. Will begin Abx, check Covid and discuss with General Surgery.  [CS]  2215 Spoke with Dr. Freida Busman, General Surgery, who will accept the patient for admission.  [CS]    Clinical Course User Index [CS] Pollyann Savoy, MD    Final Clinical Impression(s) / ED Diagnoses Final diagnoses:  Acute appendicitis with localized peritonitis, without perforation, abscess, or gangrene    Rx / DC Orders ED Discharge Orders     None        Pollyann Savoy, MD 12/12/20 2216

## 2020-12-12 NOTE — ED Notes (Signed)
Called patient to triage, unable to locate at this time. 

## 2020-12-12 NOTE — ED Notes (Signed)
Urine sent

## 2020-12-13 ENCOUNTER — Observation Stay (HOSPITAL_COMMUNITY): Payer: Self-pay | Admitting: Certified Registered"

## 2020-12-13 ENCOUNTER — Encounter (HOSPITAL_COMMUNITY): Payer: Self-pay

## 2020-12-13 ENCOUNTER — Encounter (HOSPITAL_COMMUNITY): Admission: EM | Disposition: A | Discharge status: Home / Self Care | Payer: Self-pay | Source: Home / Self Care

## 2020-12-13 HISTORY — PX: LAPAROSCOPIC APPENDECTOMY: SHX408

## 2020-12-13 LAB — SURGICAL PCR SCREEN
MRSA, PCR: NEGATIVE
Staphylococcus aureus: NEGATIVE

## 2020-12-13 LAB — HIV ANTIBODY (ROUTINE TESTING W REFLEX): HIV Screen 4th Generation wRfx: NONREACTIVE

## 2020-12-13 SURGERY — APPENDECTOMY, LAPAROSCOPIC
Anesthesia: General | Site: Abdomen

## 2020-12-13 MED ORDER — ONDANSETRON HCL 4 MG/2ML IJ SOLN
4.0000 mg | Freq: Four times a day (QID) | INTRAMUSCULAR | Status: DC | PRN
  Administered 2020-12-13 – 2020-12-14 (×2): 4 mg via INTRAVENOUS
  Filled 2020-12-13 (×2): qty 2

## 2020-12-13 MED ORDER — ONDANSETRON HCL 4 MG/2ML IJ SOLN
INTRAMUSCULAR | Status: AC
  Filled 2020-12-13: qty 2

## 2020-12-13 MED ORDER — DEXAMETHASONE SODIUM PHOSPHATE 10 MG/ML IJ SOLN
INTRAMUSCULAR | Status: DC | PRN
  Administered 2020-12-13: 5 mg via INTRAVENOUS

## 2020-12-13 MED ORDER — LACTATED RINGERS IV SOLN: INTRAVENOUS | Status: DC

## 2020-12-13 MED ORDER — ACETAMINOPHEN 500 MG PO TABS
1000.0000 mg | ORAL_TABLET | Freq: Four times a day (QID) | ORAL | Status: DC
  Administered 2020-12-13 – 2020-12-15 (×7): 1000 mg via ORAL
  Filled 2020-12-13 (×7): qty 2

## 2020-12-13 MED ORDER — OXYCODONE HCL 5 MG PO TABS: 5.0000 mg | ORAL_TABLET | ORAL | Status: DC | PRN

## 2020-12-13 MED ORDER — ENOXAPARIN SODIUM 40 MG/0.4ML IJ SOSY
40.0000 mg | PREFILLED_SYRINGE | INTRAMUSCULAR | Status: DC
  Administered 2020-12-14 – 2020-12-15 (×2): 40 mg via SUBCUTANEOUS
  Filled 2020-12-13 (×2): qty 0.4

## 2020-12-13 MED ORDER — ONDANSETRON 4 MG PO TBDP: 4.0000 mg | ORAL_TABLET | Freq: Four times a day (QID) | ORAL | Status: DC | PRN

## 2020-12-13 MED ORDER — HYDROMORPHONE HCL 1 MG/ML IJ SOLN
0.5000 mg | INTRAMUSCULAR | Status: DC | PRN
  Administered 2020-12-13 (×4): 0.5 mg via INTRAVENOUS
  Filled 2020-12-13 (×4): qty 0.5

## 2020-12-13 MED ORDER — MIDAZOLAM HCL 2 MG/2ML IJ SOLN
INTRAMUSCULAR | Status: AC
  Filled 2020-12-13: qty 2

## 2020-12-13 MED ORDER — OXYCODONE HCL 5 MG PO TABS: 5.0000 mg | ORAL_TABLET | Freq: Once | ORAL | Status: DC | PRN

## 2020-12-13 MED ORDER — ONDANSETRON HCL 4 MG/2ML IJ SOLN: 4.0000 mg | Freq: Once | INTRAMUSCULAR | Status: DC | PRN

## 2020-12-13 MED ORDER — HYDROMORPHONE HCL 1 MG/ML IJ SOLN
INTRAMUSCULAR | Status: AC
  Filled 2020-12-13: qty 1

## 2020-12-13 MED ORDER — ROCURONIUM BROMIDE 10 MG/ML (PF) SYRINGE
PREFILLED_SYRINGE | INTRAVENOUS | Status: DC | PRN
  Administered 2020-12-13: 40 mg via INTRAVENOUS

## 2020-12-13 MED ORDER — ACETAMINOPHEN 325 MG PO TABS: 650.0000 mg | ORAL_TABLET | Freq: Four times a day (QID) | ORAL | Status: DC | PRN

## 2020-12-13 MED ORDER — CHLORHEXIDINE GLUCONATE 0.12 % MT SOLN: 15.0000 mL | Freq: Once | OROMUCOSAL | Status: DC

## 2020-12-13 MED ORDER — CHLORHEXIDINE GLUCONATE 0.12 % MT SOLN
OROMUCOSAL | Status: AC
  Administered 2020-12-13: 15 mL
  Filled 2020-12-13: qty 15

## 2020-12-13 MED ORDER — SODIUM CHLORIDE 0.9 % IR SOLN
Status: DC | PRN
  Administered 2020-12-13 (×2): 1000 mL

## 2020-12-13 MED ORDER — BUPIVACAINE-EPINEPHRINE 0.5% -1:200000 IJ SOLN
INTRAMUSCULAR | Status: AC
  Filled 2020-12-13: qty 1

## 2020-12-13 MED ORDER — FENTANYL CITRATE (PF) 250 MCG/5ML IJ SOLN
INTRAMUSCULAR | Status: AC
  Filled 2020-12-13: qty 5

## 2020-12-13 MED ORDER — BUPIVACAINE-EPINEPHRINE 0.5% -1:200000 IJ SOLN
INTRAMUSCULAR | Status: DC | PRN
  Administered 2020-12-13: 15 mL

## 2020-12-13 MED ORDER — PROPOFOL 10 MG/ML IV BOLUS
INTRAVENOUS | Status: DC | PRN
  Administered 2020-12-13: 160 mg via INTRAVENOUS

## 2020-12-13 MED ORDER — DOCUSATE SODIUM 100 MG PO CAPS
100.0000 mg | ORAL_CAPSULE | Freq: Two times a day (BID) | ORAL | Status: DC
  Administered 2020-12-13 (×2): 100 mg via ORAL
  Filled 2020-12-13 (×3): qty 1

## 2020-12-13 MED ORDER — DEXAMETHASONE SODIUM PHOSPHATE 10 MG/ML IJ SOLN
INTRAMUSCULAR | Status: AC
  Filled 2020-12-13: qty 1

## 2020-12-13 MED ORDER — ORAL CARE MOUTH RINSE: 15.0000 mL | Freq: Once | OROMUCOSAL | Status: DC

## 2020-12-13 MED ORDER — HYDROMORPHONE HCL 1 MG/ML IJ SOLN
0.2500 mg | INTRAMUSCULAR | Status: DC | PRN
  Administered 2020-12-13: 0.5 mg via INTRAVENOUS

## 2020-12-13 MED ORDER — ROCURONIUM BROMIDE 10 MG/ML (PF) SYRINGE
PREFILLED_SYRINGE | INTRAVENOUS | Status: AC
  Filled 2020-12-13: qty 10

## 2020-12-13 MED ORDER — LIDOCAINE 2% (20 MG/ML) 5 ML SYRINGE
INTRAMUSCULAR | Status: AC
  Filled 2020-12-13: qty 5

## 2020-12-13 MED ORDER — SCOPOLAMINE 1 MG/3DAYS TD PT72
1.0000 | MEDICATED_PATCH | TRANSDERMAL | Status: DC
  Filled 2020-12-13: qty 1

## 2020-12-13 MED ORDER — SCOPOLAMINE 1 MG/3DAYS TD PT72
MEDICATED_PATCH | TRANSDERMAL | Status: AC
  Administered 2020-12-13: 1.5 mg
  Filled 2020-12-13: qty 1

## 2020-12-13 MED ORDER — KETOROLAC TROMETHAMINE 30 MG/ML IJ SOLN
30.0000 mg | Freq: Three times a day (TID) | INTRAMUSCULAR | Status: DC
  Administered 2020-12-13 – 2020-12-15 (×5): 30 mg via INTRAVENOUS
  Filled 2020-12-13 (×5): qty 1

## 2020-12-13 MED ORDER — OXYCODONE HCL 5 MG/5ML PO SOLN
5.0000 mg | Freq: Once | ORAL | Status: DC | PRN
Start: 2020-12-13 — End: 2020-12-13

## 2020-12-13 MED ORDER — MIDAZOLAM HCL 2 MG/2ML IJ SOLN
INTRAMUSCULAR | Status: DC | PRN
  Administered 2020-12-13: 2 mg via INTRAVENOUS

## 2020-12-13 MED ORDER — SODIUM CHLORIDE 0.9 % IV SOLN
2.0000 g | INTRAVENOUS | Status: DC
  Administered 2020-12-13 – 2020-12-14 (×2): 2 g via INTRAVENOUS
  Filled 2020-12-13: qty 20
  Filled 2020-12-13: qty 2
  Filled 2020-12-13: qty 20

## 2020-12-13 MED ORDER — 0.9 % SODIUM CHLORIDE (POUR BTL) OPTIME
TOPICAL | Status: DC | PRN
  Administered 2020-12-13: 1000 mL

## 2020-12-13 MED ORDER — PROPOFOL 10 MG/ML IV BOLUS
INTRAVENOUS | Status: AC
  Filled 2020-12-13: qty 20

## 2020-12-13 MED ORDER — LIDOCAINE 2% (20 MG/ML) 5 ML SYRINGE
INTRAMUSCULAR | Status: DC | PRN
  Administered 2020-12-13: 40 mg via INTRAVENOUS

## 2020-12-13 MED ORDER — SUCCINYLCHOLINE CHLORIDE 200 MG/10ML IV SOSY
PREFILLED_SYRINGE | INTRAVENOUS | Status: AC
  Filled 2020-12-13: qty 10

## 2020-12-13 MED ORDER — FENTANYL CITRATE (PF) 100 MCG/2ML IJ SOLN
INTRAMUSCULAR | Status: DC | PRN
  Administered 2020-12-13: 100 ug via INTRAVENOUS
  Administered 2020-12-13: 50 ug via INTRAVENOUS
  Administered 2020-12-13: 100 ug via INTRAVENOUS

## 2020-12-13 MED ORDER — ENOXAPARIN SODIUM 40 MG/0.4ML IJ SOSY: 40.0000 mg | PREFILLED_SYRINGE | INTRAMUSCULAR | Status: DC

## 2020-12-13 MED ORDER — SUCCINYLCHOLINE CHLORIDE 200 MG/10ML IV SOSY
PREFILLED_SYRINGE | INTRAVENOUS | Status: DC | PRN
Start: 2020-12-13 — End: 2020-12-13
  Administered 2020-12-13: 120 mg via INTRAVENOUS

## 2020-12-13 MED ORDER — METRONIDAZOLE 500 MG/100ML IV SOLN
500.0000 mg | Freq: Three times a day (TID) | INTRAVENOUS | Status: DC
  Administered 2020-12-13 – 2020-12-15 (×7): 500 mg via INTRAVENOUS
  Filled 2020-12-13 (×6): qty 100

## 2020-12-13 MED ORDER — ONDANSETRON HCL 4 MG/2ML IJ SOLN
INTRAMUSCULAR | Status: DC | PRN
Start: 2020-12-13 — End: 2020-12-13
  Administered 2020-12-13: 4 mg via INTRAVENOUS

## 2020-12-13 SURGICAL SUPPLY — 43 items
APPLIER CLIP ROT 10 11.4 M/L (STAPLE)
BAG COUNTER SPONGE SURGICOUNT (BAG) ×2 IMPLANT
BLADE CLIPPER SURG (BLADE) IMPLANT
CANISTER SUCT 3000ML PPV (MISCELLANEOUS) ×2 IMPLANT
CHLORAPREP W/TINT 26 (MISCELLANEOUS) ×2 IMPLANT
CLIP APPLIE ROT 10 11.4 M/L (STAPLE) IMPLANT
COVER SURGICAL LIGHT HANDLE (MISCELLANEOUS) ×2 IMPLANT
CUTTER FLEX LINEAR 45M (STAPLE) ×2 IMPLANT
DERMABOND ADVANCED (GAUZE/BANDAGES/DRESSINGS) ×1
DERMABOND ADVANCED .7 DNX12 (GAUZE/BANDAGES/DRESSINGS) ×1 IMPLANT
ELECT REM PT RETURN 9FT ADLT (ELECTROSURGICAL) ×2
ELECTRODE REM PT RTRN 9FT ADLT (ELECTROSURGICAL) ×1 IMPLANT
ENDOLOOP SUT PDS II  0 18 (SUTURE)
ENDOLOOP SUT PDS II 0 18 (SUTURE) IMPLANT
GLOVE SRG 8 PF TXTR STRL LF DI (GLOVE) ×1 IMPLANT
GLOVE SURG ENC MOIS LTX SZ8 (GLOVE) ×2 IMPLANT
GLOVE SURG UNDER POLY LF SZ8 (GLOVE) ×1
GOWN STRL REUS W/ TWL LRG LVL3 (GOWN DISPOSABLE) ×2 IMPLANT
GOWN STRL REUS W/ TWL XL LVL3 (GOWN DISPOSABLE) ×1 IMPLANT
GOWN STRL REUS W/TWL LRG LVL3 (GOWN DISPOSABLE) ×2
GOWN STRL REUS W/TWL XL LVL3 (GOWN DISPOSABLE) ×1
KIT BASIN OR (CUSTOM PROCEDURE TRAY) ×2 IMPLANT
KIT TURNOVER KIT B (KITS) ×2 IMPLANT
NS IRRIG 1000ML POUR BTL (IV SOLUTION) ×2 IMPLANT
PAD ARMBOARD 7.5X6 YLW CONV (MISCELLANEOUS) ×4 IMPLANT
POUCH RETRIEVAL ECOSAC 10 (ENDOMECHANICALS) ×1 IMPLANT
POUCH RETRIEVAL ECOSAC 10MM (ENDOMECHANICALS) ×1
RELOAD STAPLE TA45 3.5 REG BLU (ENDOMECHANICALS) ×2 IMPLANT
SCISSORS LAP 5X35 DISP (ENDOMECHANICALS) ×2 IMPLANT
SET IRRIG TUBING LAPAROSCOPIC (IRRIGATION / IRRIGATOR) ×2 IMPLANT
SET TUBE SMOKE EVAC HIGH FLOW (TUBING) ×2 IMPLANT
SHEARS HARMONIC ACE PLUS 36CM (ENDOMECHANICALS) ×2 IMPLANT
SPECIMEN JAR SMALL (MISCELLANEOUS) ×2 IMPLANT
SUT MON AB 4-0 PC3 18 (SUTURE) ×2 IMPLANT
TOWEL GREEN STERILE (TOWEL DISPOSABLE) ×2 IMPLANT
TOWEL GREEN STERILE FF (TOWEL DISPOSABLE) ×2 IMPLANT
TRAY FOLEY W/BAG SLVR 16FR (SET/KITS/TRAYS/PACK) ×1
TRAY FOLEY W/BAG SLVR 16FR ST (SET/KITS/TRAYS/PACK) ×1 IMPLANT
TRAY LAPAROSCOPIC MC (CUSTOM PROCEDURE TRAY) ×2 IMPLANT
TROCAR XCEL BLADELESS 5X75MML (TROCAR) ×4 IMPLANT
TROCAR XCEL BLUNT TIP 100MML (ENDOMECHANICALS) ×2 IMPLANT
WARMER LAPAROSCOPE (MISCELLANEOUS) ×2 IMPLANT
WATER STERILE IRR 1000ML POUR (IV SOLUTION) ×2 IMPLANT

## 2020-12-13 NOTE — Interval H&P Note (Signed)
History and Physical Interval Note:  12/13/2020 10:54 AM  Anita [name]  has presented today for surgery, with the diagnosis of Acute Appendicitis.  The various methods of treatment have been discussed with the patient and family. After consideration of risks, benefits and other options for treatment, the patient has consented to  Procedure(s): APPENDECTOMY LAPAROSCOPIC (N/A) as a surgical intervention.  The patient's history has been reviewed, patient examined, no change in status, stable for surgery.  I have reviewed the patient's chart and labs.  Questions were answered to the patient's satisfaction.   Pt seen examined and agree  Discussed laparoscopic, open and medical management of this disease were discussed   She has opted for laparoscopic appendectomy   The procedure has been discussed with the patient.  Alternative therapies have been discussed with the patient.  Operative risks include bleeding,  Infection,  Organ injury,  Nerve injury,  Blood vessel injury,  DVT,  Pulmonary embolism,  Death,  And possible reoperation.  Medical management risks include worsening of present situation.  The success of the procedure is 50 -90 % at treating patients symptoms.  The patient understands and agrees to proceed.   Anita [name] A Seirra Kos

## 2020-12-13 NOTE — Op Note (Signed)
Appendectomy, Lap, Procedure Note  Indications: The patient presented with a history of right-sided abdominal pain. A CT scan and revealed findings consistent with acute appendicitis.  Operative and nonoperative treatment options were discussed.  Possibility of an open procedure, bowel resection, development of a postoperative abscess, and the need for other treatments and procedures were discussed with the patient.  She opted to proceed with laparoscopic appendectomy.  Risk of bleeding, infection, abscess formation, bowel injury, bladder injury, major blood vessel injury, DVT, cardiovascular event, septic shock, death, the need for open surgery and/or other treatments discussed.  Pre-operative Diagnosis:  Acute appendicitis with generalized peritonitis Post-operative Diagnosis: Acute appendicitis with generalized peritonitis  Surgeon: Dortha Schwalbe  MD   Assistants: Dr Mindi Slicker MD    I was personally present during the key and critical portions of this procedure and immediately available throughout the entire procedure, as documented in my operative note.    Anesthesia: General endotracheal anesthesia and Local anesthesia 0.25.% bupivacaine  ASA Class: 2  Procedure Details  The patient was seen again in the Holding Room. The risks, benefits, complications, treatment options, and expected outcomes were discussed with the patient and/or family. The possibilities of reaction to medication, pulmonary aspiration, perforation of viscus, bleeding, recurrent infection, finding a normal appendix, the need for additional procedures, failure to diagnose a condition, and creating a complication requiring transfusion or operation were discussed. There was concurrence with the proposed plan and informed consent was obtained. The site of surgery was properly noted/marked. The patient was taken to Operating Room, identified as Anita [name] and the procedure verified as Appendectomy. A Time Out was  held and the above information confirmed.  The patient was placed in the supine position and general anesthesia was induced, along with placement of orogastric tube, Venodyne boots, and a Foley catheter. The abdomen was prepped and draped in a sterile fashion. A one centimeter infraumbilical incision was made and the peritoneal cavity was accessed using the OPEN  technique. The pneumoperitoneum was then established to steady pressure of 12 mmHg. A 12 mm port was placed through the umbilical incision. Additional 5 mm cannulas then placed in the left lower quadrant of the abdomen and right upper quadrant under direct vision. A careful evaluation of the entire abdomen was carried out. The patient was placed in Trendelenburg and left lateral decubitus position. The small intestines were retracted in the cephalad and left lateral direction away from the pelvis and right lower quadrant. The patient was found to have an enlarged and inflamed appendix that was extending into the pelvis. There was evidence of perforation with minimal contamination.  The appendix was carefully dissected. A window was made in the mesoappendix at the base of the appendix. A harmonic scalpel was used across the mesoappendix. The appendix was divided at its base using an endo-GIA stapler. Minimal appendiceal stump was left in place. There was no evidence of bleeding, leakage, or complication after division of the appendix. Irrigation was also performed and irrigate suctioned from the abdomen as well. Staple line was intact upon inspection with no signs of bleeding. The umbilical port site was closed using 0 vicryl pursestring sutures fashion at the level of the fascia. The trocar site skin wounds were closed using skin staples.  Instrument, sponge, and needle counts were correct at the conclusion of the case.   Findings: The appendix was found to be inflamed. There were signs of necrosis.  There was perforation. There was not abscess  formation.  Estimated Blood  Loss:  less than 50 mL         Drains: None         Total IV Fluids: Per anesthesia record         Specimens: Appendix         Complications:  None; patient tolerated the procedure well.         Disposition: PACU - hemodynamically stable.         Condition: stable

## 2020-12-13 NOTE — H&P (Signed)
Anita [name] Apr 16, 1991  [id number].    Chief Complaint/Reason for Consult: abdominal pain  HPI:  Anita [name] is a 30 yo female who presented to the ED this evening with abdominal pain, nausea and vomiting. She has been having pain in the lower abdomen intermittently for the last 3 days, but for the last day it has been much worse. She has also had several episodes of vomiting after eating.  The pain is periumbilical but also radiates to the lower quadrants. WBC is mildly elevated at 12. CT scan was consistent with acute appendicitis.   Prior abdominal surgeries include C section x3.   ROS: Review of Systems  Constitutional:  Negative for fever.  Respiratory:  Negative for shortness of breath and wheezing.   Gastrointestinal:  Positive for abdominal pain, constipation, nausea and vomiting.  Neurological:  Negative for weakness.   History reviewed. No pertinent family history.  Past Medical History:  Diagnosis Date   Depression    takes no meds    Past Surgical History:  Procedure Laterality Date   CERVICAL CERCLAGE  07/03/2011   Procedure: CERCLAGE CERVICAL;  Surgeon: Kathreen Cosier, MD;  Location: [facility];  Service: Gynecology;  Laterality: N/A;   CESAREAN SECTION  2010   55month fetal demise.Marland Kitchen    CESAREAN SECTION  12/04/2011   Procedure: CESAREAN SECTION;  Surgeon: Kathreen Cosier, MD;  Location: [facility];  Service: Gynecology;  Laterality: N/A;   CESAREAN SECTION WITH BILATERAL TUBAL LIGATION Bilateral 12/31/2016   Procedure: REPEAT CESAREAN SECTION;  Surgeon: Willodean Rosenthal, MD;  Location: [facility];  Service: Obstetrics;  Laterality: Bilateral;    Social History:  reports that she quit smoking about 10 years ago. Her smoking use included cigarettes. She has never used smokeless tobacco. She reports that she does not drink alcohol and does not use drugs.  Allergies: No Known Allergies  No medications prior to admission.      Physical Exam: Blood pressure 101/61, pulse 88, temperature 98.9 F (37.2 C), temperature source Oral, resp. rate 17, height 5\' 3"  (1.6 m), weight 67.1 kg, last menstrual period 11/12/2020, SpO2 100 %, unknown if currently breastfeeding. General: resting comfortably, appears stated age, no apparent distress Neurological: alert and oriented, no focal deficits, cranial nerves grossly in tact HEENT: normocephalic, atraumatic, oropharynx clear, no scleral icterus CV: extremities warm and well-perfused Respiratory: normal work of breathing on room air, symmetric chest wall expansion Abdomen: soft, nondistended, focally tender to palpation in the periumbilical area and RLQ. No masses or organomegaly. Extremities: warm and well-perfused, no deformities, moving all extremities spontaneously Psychiatric: normal mood and affect Skin: warm and dry, no jaundice, no rashes or lesions   Results for orders placed or performed during the hospital encounter of 12/12/20 (from the past 48 hour(s))  Lipase, blood     Status: Abnormal   Collection Time: 12/12/20  4:55 PM  Result Value Ref Range   Lipase <10 (L) 11 - 51 U/L    Comment: Performed at 12/14/20, [address], [place], [place]  Comprehensive metabolic panel     Status: Abnormal   Collection Time: 12/12/20  4:55 PM  Result Value Ref Range   Sodium 137 135 - 145 mmol/L   Potassium 3.4 (L) 3.5 - 5.1 mmol/L   Chloride 103 98 - 111 mmol/L   CO2 22 22 - 32 mmol/L   Glucose, Bld 148 (H) 70 - 99 mg/dL    Comment: Glucose reference range  applies only to samples taken after fasting for at least 8 hours.   BUN 10 6 - 20 mg/dL   Creatinine, Ser 1.610.65 0.44 - 1.00 mg/dL   Calcium 9.2 8.9 - 09.610.3 mg/dL   Total Protein 7.7 6.5 - 8.1 g/dL   Albumin 4.5 3.5 - 5.0 g/dL   AST 11 (L) 15 - 41 U/L   ALT 9 0 - 44 U/L   Alkaline Phosphatase 56 38 - 126 U/L   Total Bilirubin 1.4 (H) 0.3 - 1.2 mg/dL   GFR, Estimated >04>60 >54>60  mL/min    Comment: (NOTE) Calculated using the CKD-EPI Creatinine Equation (2021)    Anion gap 12 5 - 15    Comment: Performed at [facility], [address], [place], [place] [address]  CBC     Status: Abnormal   Collection Time: 12/12/20  4:55 PM  Result Value Ref Range   WBC 12.5 (H) 4.0 - 10.5 K/uL   RBC 4.27 3.87 - 5.11 MIL/uL   Hemoglobin 13.2 12.0 - 15.0 g/dL   HCT 91.437.8 78.236.0 - 95.646.0 %   MCV 88.5 80.0 - 100.0 fL   MCH 30.9 26.0 - 34.0 pg   MCHC 34.9 30.0 - 36.0 g/dL   RDW 21.313.0 08.611.5 - 57.815.5 %   Platelets 233 150 - 400 K/uL   nRBC 0.0 0.0 - 0.2 %    Comment: Performed at [facility], [address], [place], [place] [address]  Urinalysis, Routine w reflex microscopic Urine, Clean Catch     Status: Abnormal   Collection Time: 12/12/20  4:55 PM  Result Value Ref Range   Color, Urine YELLOW YELLOW   APPearance CLOUDY (A) CLEAR   Specific Gravity, Urine 1.035 (H) 1.005 - 1.030   pH 6.0 5.0 - 8.0   Glucose, UA 500 (A) NEGATIVE mg/dL   Hgb urine dipstick TRACE (A) NEGATIVE   Bilirubin Urine NEGATIVE NEGATIVE   Ketones, ur TRACE (A) NEGATIVE mg/dL   Protein, ur 30 (A) NEGATIVE mg/dL   Nitrite NEGATIVE NEGATIVE   Leukocytes,Ua NEGATIVE NEGATIVE   RBC / HPF 0-5 0 - 5 RBC/hpf   WBC, UA 0-5 0 - 5 WBC/hpf   Squamous Epithelial / LPF 0-5 0 - 5   Mucus PRESENT    Amorphous Crystal PRESENT     Comment: Performed at [facility], [address], [place], [place] [address]  Pregnancy, urine     Status: None   Collection Time: 12/12/20  4:55 PM  Result Value Ref Range   Preg Test, Ur NEGATIVE NEGATIVE    Comment:        THE SENSITIVITY OF THIS METHODOLOGY IS >20 mIU/mL. Performed at [facility], [address], [place], [place] [address]   Resp Panel by RT-PCR (Flu A&B, Covid) Nasopharyngeal Swab     Status: None   Collection Time: 12/12/20  9:44 PM   Specimen: Nasopharyngeal Swab; Nasopharyngeal(NP)  swabs in vial transport medium  Result Value Ref Range   SARS Coronavirus 2 by RT PCR NEGATIVE NEGATIVE    Comment: (NOTE) SARS-CoV-2 target nucleic acids are NOT DETECTED.  The SARS-CoV-2 RNA is generally detectable in upper respiratory specimens during the acute phase of infection. The lowest concentration of SARS-CoV-2 viral copies this assay can detect is 138 copies/mL. A negative result does not preclude SARS-Cov-2 infection and should not be used as the sole basis for treatment or other patient management decisions. A negative result may occur with  improper specimen collection/handling, submission of specimen other than nasopharyngeal swab, presence of viral mutation(s) within the areas targeted by this assay, and inadequate number of viral copies(<138 copies/mL). A negative result must be combined with clinical observations, patient history, and epidemiological information. The expected result is Negative.  Fact Sheet for Patients:  BloggerCourse.com  Fact Sheet for Healthcare Providers:  SeriousBroker.it  This test is no t yet approved or cleared by the [place] FDA and  has been authorized for detection and/or diagnosis of SARS-CoV-2 by FDA under an Emergency Use Authorization (EUA). This EUA will remain  in effect (meaning this test can be used) for the duration of the COVID-19 declaration under Section 564(b)(1) of the Act, 21 U.S.C.section 360bbb-3(b)(1), unless the authorization is terminated  or revoked sooner.       Influenza A by PCR NEGATIVE NEGATIVE   Influenza B by PCR NEGATIVE NEGATIVE    Comment: (NOTE) The Xpert Xpress SARS-CoV-2/FLU/RSV plus assay is intended as an aid in the diagnosis of influenza from Nasopharyngeal swab specimens and should not be used as a sole basis for treatment. Nasal washings and aspirates are unacceptable for Xpert Xpress SARS-CoV-2/FLU/RSV testing.  Fact Sheet for  Patients: BloggerCourse.com  Fact Sheet for Healthcare Providers: SeriousBroker.it  This test is not yet approved or cleared by the [place] FDA and has been authorized for detection and/or diagnosis of SARS-CoV-2 by FDA under an Emergency Use Authorization (EUA). This EUA will remain in effect (meaning this test can be used) for the duration of the COVID-19 declaration under Section 564(b)(1) of the Act, 21 U.S.C. section 360bbb-3(b)(1), unless the authorization is terminated or revoked.  Performed at [facility], [address] [place], [place] [address]    CT Abdomen Pelvis W Contrast  Result Date: 12/12/2020 CLINICAL DATA:  RLQ abdominal pain, appendicitis suspected (Age >= 14y) EXAM: CT ABDOMEN AND PELVIS WITH CONTRAST TECHNIQUE: Multidetector CT imaging of the abdomen and pelvis was performed using the standard protocol following bolus administration of intravenous contrast. CONTRAST:  42mL OMNIPAQUE IOHEXOL 350 MG/ML SOLN COMPARISON:  None. FINDINGS: Lower chest: No acute abnormality. Hepatobiliary: No focal liver abnormality is seen. No gallstones, gallbladder wall thickening, or biliary dilatation. Pancreas: Unremarkable. No pancreatic ductal dilatation or surrounding inflammatory changes. Spleen: Normal in size without focal abnormality. Adrenals/Urinary Tract: Adrenal glands are unremarkable. No hydronephrosis. Punctate nonobstructive right renal stone in the mid kidney. Bladder is unremarkable. Stomach/Bowel: The stomach is within normal limits. There is no evidence of bowel obstruction. The appendix is dilated with periappendiceal fat stranding and hyperenhancing appendiceal wall. Vascular/Lymphatic: No significant vascular findings are present. Reproductive: Corpus luteal cyst on the right ovary. Arcuate uterine morphology. Other: No abdominal hernia.  Small volume pelvic free fluid. Musculoskeletal: No  acute or significant osseous findings. IMPRESSION: Acute appendicitis. Small volume pelvic free fluid. No rim enhancing abscess. Appendix: Location: Pelvis Diameter: 1.0 cm Appendicolith: None Mucosal hyper-enhancement: Yes Extraluminal gas: None Periappendiceal collection: No Electronically Signed   By: Caprice Renshaw   On: 12/12/2020 21:19      Assessment/Plan 30 yo female with acute appendicitis. I reviewed her CT scan, which shows a dilated fluid-filled appendix. No fecalith or abscess. - NPO, IV fluid hydration - Abx: rocephin and flagyl - OR in am for laparoscopic appendectomy. I discussed the procedure details with the patient and she agrees to proceed. - VTE: lovenox, SCDs - Dispo: admit to observation for surgery in the morning.   Sophronia Simas, MD [facility] Surgery General,  Hepatobiliary and Pancreatic Surgery 12/13/20 1:45 AM

## 2020-12-13 NOTE — ED Notes (Signed)
Report given to Ilean Skill RN

## 2020-12-13 NOTE — Progress Notes (Signed)
Patient arrived to [facility]. Report received from Melosino, Charity fundraiser. Patient alert and oriented x4.Pt c/o 8 out of 10 abdominal pain. MD made aware of Pts arrival. Patient call bell within reach. Will continue to monitor.

## 2020-12-13 NOTE — Anesthesia Preprocedure Evaluation (Addendum)
Anesthesia Evaluation  Patient identified by MRN, date of birth, ID band Patient awake    Reviewed: Allergy & Precautions, NPO status , Patient's Chart, lab work & pertinent test results  Airway Mallampati: II  TM Distance: >3 FB Neck ROM: Full    Dental no notable dental hx. (+) Teeth Intact, Dental Advisory Given   Pulmonary former smoker,    Pulmonary exam normal breath sounds clear to auscultation       Cardiovascular negative cardio ROS Normal cardiovascular exam Rhythm:Regular Rate:Normal     Neuro/Psych PSYCHIATRIC DISORDERS Depression negative neurological ROS     GI/Hepatic Neg liver ROS, Acute appendicitis   Endo/Other  negative endocrine ROS  Renal/GU negative Renal ROS  negative genitourinary   Musculoskeletal negative musculoskeletal ROS (+)   Abdominal   Peds  Hematology negative hematology ROS (+)   Anesthesia Other Findings   Reproductive/Obstetrics negative OB ROS                            Anesthesia Physical Anesthesia Plan  ASA: 2 and emergent  Anesthesia Plan: General   Post-op Pain Management:    Induction: Intravenous  PONV Risk Score and Plan: 4 or greater and Treatment may vary due to age or medical condition and Ondansetron  Airway Management Planned: Oral ETT  Additional Equipment:   Intra-op Plan:   Post-operative Plan: Extubation in OR  Informed Consent: I have reviewed the patients History and Physical, chart, labs and discussed the procedure including the risks, benefits and alternatives for the proposed anesthesia with the patient or authorized representative who has indicated his/her understanding and acceptance.     Dental advisory given  Plan Discussed with: CRNA and Anesthesiologist  Anesthesia Plan Comments:         Anesthesia Quick Evaluation

## 2020-12-13 NOTE — Transfer of Care (Signed)
Immediate Anesthesia Transfer of Care Note  Patient: Anita [name]  Procedure(s) Performed: APPENDECTOMY LAPAROSCOPIC (Abdomen)  Patient Location: PACU  Anesthesia Type:General  Level of Consciousness: awake and patient cooperative  Airway & Oxygen Therapy: Patient Spontanous Breathing and Patient connected to nasal cannula oxygen  Post-op Assessment: Report given to RN, Post -op Vital signs reviewed and stable and Patient moving all extremities  Post vital signs: Reviewed and stable  Last Vitals:  Vitals Value Taken Time  BP 132/74 12/13/20 1213  Temp    Pulse 129 12/13/20 1214  Resp 24 12/13/20 1214  SpO2 100 % 12/13/20 1214  Vitals shown include unvalidated device data.  Last Pain:  Vitals:   12/13/20 1034  TempSrc:   PainSc: 6       Patients Stated Pain Goal: 3 (12/13/20 0057)  Complications: No notable events documented.

## 2020-12-13 NOTE — Anesthesia Postprocedure Evaluation (Signed)
Anesthesia Post Note  Patient: Anita [name]  Procedure(s) Performed: APPENDECTOMY LAPAROSCOPIC (Abdomen)     Patient location during evaluation: PACU Anesthesia Type: General Level of consciousness: awake and alert and oriented Pain management: pain level controlled Vital Signs Assessment: post-procedure vital signs reviewed and stable Respiratory status: spontaneous breathing, nonlabored ventilation and respiratory function stable Cardiovascular status: blood pressure returned to baseline and stable Postop Assessment: no apparent nausea or vomiting Anesthetic complications: no   No notable events documented.  Last Vitals:  Vitals:   12/13/20 1243 12/13/20 1245  BP: 104/62   Pulse: (!) 110   Resp: 15   Temp:  37.2 C  SpO2: 96%     Last Pain:  Vitals:   12/13/20 1243  TempSrc:   PainSc: Asleep                 Zylie Mumaw A.

## 2020-12-13 NOTE — ED Notes (Signed)
Report gi

## 2020-12-13 NOTE — Anesthesia Procedure Notes (Addendum)
Procedure Name: Intubation Date/Time: 12/13/2020 11:02 AM Performed by: Moshe Salisbury, CRNA Pre-anesthesia Checklist: Patient identified, Emergency Drugs available, Suction available and Patient being monitored Patient Re-evaluated:Patient Re-evaluated prior to induction Oxygen Delivery Method: Circle System Utilized Preoxygenation: Pre-oxygenation with 100% oxygen Induction Type: IV induction, Rapid sequence and Cricoid Pressure applied Laryngoscope Size: Mac and 3 Grade View: Grade I Tube type: Oral Tube size: 7.0 mm Number of attempts: 1 Airway Equipment and Method: Stylet Placement Confirmation: ETT inserted through vocal cords under direct vision, positive ETCO2 and breath sounds checked- equal and bilateral Secured at: 20 cm Tube secured with: Tape Dental Injury: Teeth and Oropharynx as per pre-operative assessment

## 2020-12-13 NOTE — ED Notes (Signed)
Pt has been transferred to [facility] via [facility] - pt is stable

## 2020-12-14 ENCOUNTER — Encounter (HOSPITAL_COMMUNITY): Payer: Self-pay | Admitting: Surgery

## 2020-12-14 LAB — BASIC METABOLIC PANEL
Anion gap: 7 (ref 5–15)
BUN: 9 mg/dL (ref 6–20)
CO2: 25 mmol/L (ref 22–32)
Calcium: 9 mg/dL (ref 8.9–10.3)
Chloride: 101 mmol/L (ref 98–111)
Creatinine, Ser: 0.66 mg/dL (ref 0.44–1.00)
GFR, Estimated: >60 mL/min (ref >60)
Glucose, Bld: 118 mg/dL — ABNORMAL HIGH (ref 70–99)
Potassium: 3.7 mmol/L (ref 3.5–5.1)
Sodium: 133 mmol/L — ABNORMAL LOW (ref 135–145)

## 2020-12-14 LAB — CBC
HCT: 32 % — ABNORMAL LOW (ref 36.0–46.0)
Hemoglobin: 11.3 g/dL — ABNORMAL LOW (ref 12.0–15.0)
MCH: 32 pg (ref 26.0–34.0)
MCHC: 35.3 g/dL (ref 30.0–36.0)
MCV: 90.7 fL (ref 80.0–100.0)
Platelets: 200 10*3/uL (ref 150–400)
RBC: 3.53 MIL/uL — ABNORMAL LOW (ref 3.87–5.11)
RDW: 13.1 % (ref 11.5–15.5)
WBC: 17.1 10*3/uL — ABNORMAL HIGH (ref 4.0–10.5)
nRBC: 0 % (ref 0.0–0.2)

## 2020-12-14 LAB — SURGICAL PATHOLOGY

## 2020-12-14 MED ORDER — ALUM & MAG HYDROXIDE-SIMETH 200-200-20 MG/5ML PO SUSP
30.0000 mL | Freq: Four times a day (QID) | ORAL | Status: DC | PRN
  Administered 2020-12-14: 30 mL via ORAL
  Filled 2020-12-14: qty 30

## 2020-12-14 NOTE — Plan of Care (Signed)
  Problem: Education: Goal: Knowledge of General Education information will improve Description: Including pain rating scale, medication(s)/side effects and non-pharmacologic comfort measures Outcome: Progressing   Problem: Activity: Goal: Risk for activity intolerance will decrease Outcome: Progressing   Problem: Nutrition: Goal: Adequate nutrition will be maintained Outcome: Progressing   Problem: Elimination: Goal: Will not experience complications related to bowel motility Outcome: Progressing   Problem: Pain Managment: Goal: General experience of comfort will improve Outcome: Progressing   

## 2020-12-14 NOTE — Progress Notes (Signed)
1 Day Post-Op  Subjective: Patient feeling well today overall.  Tolerating CLD.  Had a small episode of diarrhea this am.  No nausea.    ROS: See above, otherwise other systems negative  Objective: Vital signs in last 24 hours: Temp:  [98 F (36.7 C)-99 F (37.2 C)] 98.2 F (36.8 C) (07/27 0755) Pulse Rate:  [53-130] 58 (07/27 0755) Resp:  [15-23] 18 (07/27 0755) BP: (90-132)/(51-74) 93/57 (07/27 0755) SpO2:  [96 %-100 %] 96 % (07/27 0755) Last BM Date: 12/14/20  Intake/Output from previous day: 07/26 0701 - 07/27 0700 In: 1917.8 [P.O.:540; I.V.:920.9; IV Piggyback:456.9] Out: 75 [Urine:25; Blood:50] Intake/Output this shift: No intake/output data recorded.  PE: Abd: soft, appropriately tender, +BS, ND, incisions c/d/i  Lab Results:  Recent Labs    12/12/20 1655 12/14/20 0133  WBC 12.5* 17.1*  HGB 13.2 11.3*  HCT 37.8 32.0*  PLT 233 200   BMET Recent Labs    12/12/20 1655 12/14/20 0133  NA 137 133*  K 3.4* 3.7  CL 103 101  CO2 22 25  GLUCOSE 148* 118*  BUN 10 9  CREATININE 0.65 0.66  CALCIUM 9.2 9.0   PT/INR No results for input(s): LABPROT, INR in the last 72 hours. CMP     Component Value Date/Time   NA 133 (L) 12/14/2020 0133   NA 140 08/30/2016 1003   K 3.7 12/14/2020 0133   CL 101 12/14/2020 0133   CO2 25 12/14/2020 0133   GLUCOSE 118 (H) 12/14/2020 0133   BUN 9 12/14/2020 0133   BUN 5 (L) 08/30/2016 1003   CREATININE 0.66 12/14/2020 0133   CALCIUM 9.0 12/14/2020 0133   PROT 7.7 12/12/2020 1655   PROT 6.7 08/30/2016 1003   ALBUMIN 4.5 12/12/2020 1655   ALBUMIN 3.7 08/30/2016 1003   AST 11 (L) 12/12/2020 1655   ALT 9 12/12/2020 1655   ALKPHOS 56 12/12/2020 1655   BILITOT 1.4 (H) 12/12/2020 1655   BILITOT 0.4 08/30/2016 1003   GFRNONAA >60 12/14/2020 0133   GFRAA >60 11/13/2019 2127   Lipase     Component Value Date/Time   LIPASE <10 (L) 12/12/2020 1655       Studies/Results: CT Abdomen Pelvis W Contrast  Result Date:  12/12/2020 CLINICAL DATA:  RLQ abdominal pain, appendicitis suspected (Age >= 14y) EXAM: CT ABDOMEN AND PELVIS WITH CONTRAST TECHNIQUE: Multidetector CT imaging of the abdomen and pelvis was performed using the standard protocol following bolus administration of intravenous contrast. CONTRAST:  7mL OMNIPAQUE IOHEXOL 350 MG/ML SOLN COMPARISON:  None. FINDINGS: Lower chest: No acute abnormality. Hepatobiliary: No focal liver abnormality is seen. No gallstones, gallbladder wall thickening, or biliary dilatation. Pancreas: Unremarkable. No pancreatic ductal dilatation or surrounding inflammatory changes. Spleen: Normal in size without focal abnormality. Adrenals/Urinary Tract: Adrenal glands are unremarkable. No hydronephrosis. Punctate nonobstructive right renal stone in the mid kidney. Bladder is unremarkable. Stomach/Bowel: The stomach is within normal limits. There is no evidence of bowel obstruction. The appendix is dilated with periappendiceal fat stranding and hyperenhancing appendiceal wall. Vascular/Lymphatic: No significant vascular findings are present. Reproductive: Corpus luteal cyst on the right ovary. Arcuate uterine morphology. Other: No abdominal hernia.  Small volume pelvic free fluid. Musculoskeletal: No acute or significant osseous findings. IMPRESSION: Acute appendicitis. Small volume pelvic free fluid. No rim enhancing abscess. Appendix: Location: Pelvis Diameter: 1.0 cm Appendicolith: None Mucosal hyper-enhancement: Yes Extraluminal gas: None Periappendiceal collection: No Electronically Signed   By: Caprice Renshaw   On: 12/12/2020 21:19  Anti-infectives: Anti-infectives (From admission, onward)    Start     Dose/Rate Route Frequency Ordered Stop   12/13/20 2100  cefTRIAXone (ROCEPHIN) 2 g in sodium chloride 0.9 % 100 mL IVPB       See Hyperspace for full Linked Orders Report.   2 g 200 mL/hr over 30 Minutes Intravenous Every 24 hours 12/13/20 0103     12/13/20 0500  metroNIDAZOLE  (FLAGYL) IVPB 500 mg       See Hyperspace for full Linked Orders Report.   500 mg 100 mL/hr over 60 Minutes Intravenous Every 8 hours 12/13/20 0103     12/12/20 2130  cefTRIAXone (ROCEPHIN) 1 g in sodium chloride 0.9 % 100 mL IVPB        1 g 200 mL/hr over 30 Minutes Intravenous  Once 12/12/20 2128 12/12/20 2213   12/12/20 2130  metroNIDAZOLE (FLAGYL) IVPB 500 mg        500 mg 100 mL/hr over 60 Minutes Intravenous  Once 12/12/20 2128 12/13/20 0010        Assessment/Plan POD 1, s/p lap appy for gangrenous/perforated appendicitis, Dr. Luisa Hart 7/26  -cont IV abx today -WBC 17K, not unexpected post op, recheck in am -adv to regular diet -if doing well with DC home tomorrow with oral abx therapy.  FEN - regular diet, SLIV VTE - Lovenox ID - Rocephin/Flagyl   LOS: 0 days    Letha Cape , [facility] Surgery 12/14/2020, 10:59 AM Please see Amion for pager number during day hours 7:00am-4:30pm or 7:00am -11:30am on weekends

## 2020-12-15 LAB — CBC
HCT: 27.2 % — ABNORMAL LOW (ref 36.0–46.0)
Hemoglobin: 9.3 g/dL — ABNORMAL LOW (ref 12.0–15.0)
MCH: 31.3 pg (ref 26.0–34.0)
MCHC: 34.2 g/dL (ref 30.0–36.0)
MCV: 91.6 fL (ref 80.0–100.0)
Platelets: 182 10*3/uL (ref 150–400)
RBC: 2.97 MIL/uL — ABNORMAL LOW (ref 3.87–5.11)
RDW: 13.2 % (ref 11.5–15.5)
WBC: 12 10*3/uL — ABNORMAL HIGH (ref 4.0–10.5)
nRBC: 0 % (ref 0.0–0.2)

## 2020-12-15 MED ORDER — OXYCODONE HCL 5 MG PO TABS: 5.0000 mg | ORAL_TABLET | ORAL | 0 refills | Status: DC | PRN

## 2020-12-15 MED ORDER — AMOXICILLIN-POT CLAVULANATE 875-125 MG PO TABS: 1.0000 | ORAL_TABLET | Freq: Two times a day (BID) | ORAL | 0 refills | Status: AC

## 2020-12-15 MED ORDER — ACETAMINOPHEN 500 MG PO TABS: 1000.0000 mg | ORAL_TABLET | Freq: Four times a day (QID) | ORAL | 0 refills | Status: DC | PRN

## 2020-12-15 NOTE — Progress Notes (Signed)
Discharge instructions given and reviewed with patient

## 2020-12-15 NOTE — Discharge Summary (Signed)
    Patient ID: Anita [name] [id number] November 10, 1990 30 y.o.  Admit date: 12/12/2020 Discharge date: 12/15/2020  Admitting Diagnosis: Acute appendicitis  Discharge Diagnosis Patient Active Problem List   Diagnosis Date Noted   Acute appendicitis 12/12/2020   Adjustment disorder with disturbance of emotion 11/15/2019   Mastitis, postpartum 01/10/2017   Status post repeat low transverse cesarean section 12/31/2016   Supervision of high risk pregnancy, antepartum 08/30/2016   Previous cesarean section 08/30/2016   History of classical cesarean section 08/30/2016   History of incompetent cervix, currently pregnant 08/23/2016   Previous preterm delivery, antepartum 08/23/2016   Late prenatal care affecting pregnancy 08/23/2016  Acute gangrenous/perforated appendicitis, s/p lap appy  Consultants none  Reason for Admission: Ms. Bradwell is a 30 yo female who presented to the ED this evening with abdominal pain, nausea and vomiting. She has been having pain in the lower abdomen intermittently for the last 3 days, but for the last day it has been much worse. She has also had several episodes of vomiting after eating.  The pain is periumbilical but also radiates to the lower quadrants. WBC is mildly elevated at 12. CT scan was consistent with acute appendicitis.   Prior abdominal surgeries include C section x3.   Procedures Lap appy, Dr. Luisa Hart 12/13/20  Hospital Course:  The patient was admitted and underwent a laparoscopic appendectomy.  The patient tolerated the procedure well. She was found to have a gangrenous/perforated appendicitis.  She remained in IV abx.  Her diet was slowly advanced.  On POD 2, the patient was tolerating a regular diet, voiding well, mobilizing, and pain was controlled with oral pain medications. Her WBC was trending down and she was AF. She was moving her bowels with some diarrhea.  The patient was stable for DC home at this time with appropriate  follow up made.   Physical Exam: Abd: soft, appropriately tender, +BS, ND, incisions c/d/i  Allergies as of 12/15/2020   No Known Allergies      Medication List     TAKE these medications    acetaminophen 500 MG tablet Commonly known as: TYLENOL Take 2 tablets (1,000 mg total) by mouth every 6 (six) hours as needed.   amoxicillin-clavulanate 875-125 MG tablet Commonly known as: Augmentin Take 1 tablet by mouth 2 (two) times daily for 5 days.   ibuprofen 200 MG tablet Commonly known as: ADVIL Take 400 mg by mouth every 6 (six) hours as needed for fever, headache or mild pain.   oxyCODONE 5 MG immediate release tablet Commonly known as: Oxy IR/ROXICODONE Take 1 tablet (5 mg total) by mouth every 4 (four) hours as needed for moderate pain.          Follow-up Information     [facility] Follow up on 12/27/2020.   Why: 9:10 am with Dr.Wilson Contact information: [address], [address] [place] [address]        [facility] [place] Follow up on 01/05/2021.   Specialty: General Surgery Why: 1:45pm, arrive by 1:15pm for paperwork and check in process Contact information: [address] [place] [address] [phone]                 Signed: Barnetta Chapel, [facility] 12/15/2020, 9:29 AM Please see Amion for pager number during day hours 7:00am-4:30pm, 7-11:30am on Weekends

## 2020-12-15 NOTE — Plan of Care (Signed)
  Problem: Education: Goal: Knowledge of General Education information will improve Description: Including pain rating scale, medication(s)/side effects and non-pharmacologic comfort measures Outcome: Adequate for Discharge   

## 2020-12-15 NOTE — Discharge Instructions (Signed)
[facility] ° °Please arrive at least 30 min before your appointment to complete your check in paperwork.  If you are unable to arrive 30 min prior to your appointment time we may have to cancel or reschedule you. °LAPAROSCOPIC SURGERY: POST OP INSTRUCTIONS °Always review your discharge instruction sheet given to you by the facility where your surgery was performed. °IF YOU HAVE DISABILITY OR FAMILY LEAVE FORMS, YOU MUST BRING THEM TO THE OFFICE FOR PROCESSING.   °DO NOT GIVE THEM TO YOUR DOCTOR. ° °PAIN CONTROL ° °First take acetaminophen (Tylenol) AND/or ibuprofen (Advil) to control your pain after surgery.  Follow directions on package.  Taking acetaminophen (Tylenol) and/or ibuprofen (Advil) regularly after surgery will help to control your pain and lower the amount of prescription pain medication you may need.  You should not take more than 4,000 mg (4 grams) of acetaminophen (Tylenol) in 24 hours.  You should not take ibuprofen (Advil), aleve, motrin, naprosyn or other NSAIDS if you have a history of stomach ulcers or chronic kidney disease.  °A prescription for pain medication may be given to you upon discharge.  Take your pain medication as prescribed, if you still have uncontrolled pain after taking acetaminophen (Tylenol) or ibuprofen (Advil). °Use ice packs to help control pain. °If you need a refill on your pain medication, please contact your pharmacy.  They will contact our office to request authorization. Prescriptions will not be filled after 5pm or on week-ends. ° °HOME MEDICATIONS °Take your usually prescribed medications unless otherwise directed. ° °DIET °You should follow a light diet the first few days after arrival home.  Be sure to include lots of fluids daily. Avoid fatty, fried foods.  ° °CONSTIPATION °It is common to experience some constipation after surgery and if you are taking pain medication.  Increasing fluid intake and taking a stool softener (such as Colace)  will usually help or prevent this problem from occurring.  A mild laxative (Milk of Magnesia or Miralax) should be taken according to package instructions if there are no bowel movements after 48 hours. ° °WOUND/INCISION CARE °Most patients will experience some swelling and bruising in the area of the incisions.  Ice packs will help.  Swelling and bruising can take several days to resolve.  °Unless discharge instructions indicate otherwise, follow guidelines below  °STERI-STRIPS - you may remove your outer bandages 48 hours after surgery, and you may shower at that time.  You have steri-strips (small skin tapes) in place directly over the incision.  These strips should be left on the skin for 7-10 days.   °DERMABOND/SKIN GLUE - you may shower in 24 hours.  The glue will flake off over the next 2-3 weeks. °Any sutures or staples will be removed at the office during your follow-up visit. ° °ACTIVITIES °You may resume regular (light) daily activities beginning the next day--such as daily self-care, walking, climbing stairs--gradually increasing activities as tolerated.  You may have sexual intercourse when it is comfortable.  Refrain from any heavy lifting or straining until approved by your doctor. °You may drive when you are no longer taking prescription pain medication, you can comfortably wear a seatbelt, and you can safely maneuver your car and apply brakes. ° °FOLLOW-UP °You should see your doctor in the office for a follow-up appointment approximately 2-3 weeks after your surgery.  You should have been given your post-op/follow-up appointment when your surgery was scheduled.  If you did not receive a post-op/follow-up appointment, make sure   that you call for this appointment within a day or two after you arrive home to insure a convenient appointment time. ° ° °WHEN TO CALL YOUR DOCTOR: °Fever over 101.0 °Inability to urinate °Continued bleeding from incision. °Increased pain, redness, or drainage from the  incision. °Increasing abdominal pain ° °The clinic staff is available to answer your questions during regular business hours.  Please don’t hesitate to call and ask to speak to one of the nurses for clinical concerns.  If you have a medical emergency, go to the nearest emergency room or call 911.  A surgeon from [facility] is always on call at the hospital. °[address], [address], [place], [place]  [address] ? [address] [address], [place], [place]   [address] °[phone] ? [phone] ? FAX [phone] ° ° ° ° °Managing Your Pain After Surgery Without Opioids ° ° ° °Thank you for participating in our program to help patients manage their pain after surgery without opioids. This is part of our effort to provide you with the best care possible, without exposing you or your family to the risk that opioids pose. ° °What pain can I expect after surgery? °You can expect to have some pain after surgery. This is normal. The pain is typically worse the day after surgery, and quickly begins to get better. °Many studies have found that many patients are able to manage their pain after surgery with Over-the-Counter (OTC) medications such as Tylenol and Motrin. If you have a condition that does not allow you to take Tylenol or Motrin, notify your surgical team. ° °How will I manage my pain? °The best strategy for controlling your pain after surgery is around the clock pain control with Tylenol (acetaminophen) and Motrin (ibuprofen or Advil). Alternating these medications with each other allows you to maximize your pain control. In addition to Tylenol and Motrin, you can use heating pads or ice packs on your incisions to help reduce your pain. ° °How will I alternate your regular strength over-the-counter pain medication? °You will take a dose of pain medication every three hours. °Start by taking 650 mg of Tylenol (2 pills of 325 mg) °3 hours later take 600 mg of Motrin (3 pills of 200 mg) °3 hours after  taking the Motrin take 650 mg of Tylenol °3 hours after that take 600 mg of Motrin. ° ° °- 1 - ° °See example - if your first dose of Tylenol is at 12:00 PM ° ° °12:00 PM Tylenol 650 mg (2 pills of 325 mg)  °3:00 PM Motrin 600 mg (3 pills of 200 mg)  °6:00 PM Tylenol 650 mg (2 pills of 325 mg)  °9:00 PM Motrin 600 mg (3 pills of 200 mg)  °Continue alternating every 3 hours  ° °We recommend that you follow this schedule around-the-clock for at least 3 days after surgery, or until you feel that it is no longer needed. Use the table on the last page of this handout to keep track of the medications you are taking. °Important: °Do not take more than 3000mg of Tylenol or 3200mg of Motrin in a 24-hour period. °Do not take ibuprofen/Motrin if you have a history of bleeding stomach ulcers, severe kidney disease, &/or actively taking a blood thinner ° °What if I still have pain? °If you have pain that is not controlled with the over-the-counter pain medications (Tylenol and Motrin or Advil) you might have what we call “breakthrough” pain. You will receive a prescription   for a small amount of an opioid pain medication such as Oxycodone, Tramadol, or Tylenol with Codeine. Use these opioid pills in the first 24 hours after surgery if you have breakthrough pain. Do not take more than 1 pill every 4-6 hours. ° °If you still have uncontrolled pain after using all opioid pills, don't hesitate to call our staff using the number provided. We will help make sure you are managing your pain in the best way possible, and if necessary, we can provide a prescription for additional pain medication. ° ° °Day 1   ° °Time  °Name of Medication Number of pills taken  °Amount of Acetaminophen  °Pain Level  ° °Comments  °AM PM       °AM PM       °AM PM       °AM PM       °AM PM       °AM PM       °AM PM       °AM PM       °Total Daily amount of Acetaminophen °Do not take more than  3,000 mg per day    ° ° °Day 2   ° °Time  °Name of Medication  Number of pills °taken  °Amount of Acetaminophen  °Pain Level  ° °Comments  °AM PM       °AM PM       °AM PM       °AM PM       °AM PM       °AM PM       °AM PM       °AM PM       °Total Daily amount of Acetaminophen °Do not take more than  3,000 mg per day    ° ° °Day 3   ° °Time  °Name of Medication Number of pills taken  °Amount of Acetaminophen  °Pain Level  ° °Comments  °AM PM       °AM PM       °AM PM       °AM PM       ° ° ° °AM PM       °AM PM       °AM PM       °AM PM       °Total Daily amount of Acetaminophen °Do not take more than  3,000 mg per day    ° ° °Day 4   ° °Time  °Name of Medication Number of pills taken  °Amount of Acetaminophen  °Pain Level  ° °Comments  °AM PM       °AM PM       °AM PM       °AM PM       °AM PM       °AM PM       °AM PM       °AM PM       °Total Daily amount of Acetaminophen °Do not take more than  3,000 mg per day    ° ° °Day 5   ° °Time  °Name of Medication Number °of pills taken  °Amount of Acetaminophen  °Pain Level  ° °Comments  °AM PM       °AM PM       °AM PM       °AM PM       °AM PM       °AM   PM       °AM PM       °AM PM       °Total Daily amount of Acetaminophen °Do not take more than  3,000 mg per day    ° ° ° °Day 6   ° °Time  °Name of Medication Number of pills °taken  °Amount of Acetaminophen  °Pain Level  °Comments  °AM PM       °AM PM       °AM PM       °AM PM       °AM PM       °AM PM       °AM PM       °AM PM       °Total Daily amount of Acetaminophen °Do not take more than  3,000 mg per day    ° ° °Day 7   ° °Time  °Name of Medication Number of pills taken  °Amount of Acetaminophen  °Pain Level  ° °Comments  °AM PM       °AM PM       °AM PM       °AM PM       °AM PM       °AM PM       °AM PM       °AM PM       °Total Daily amount of Acetaminophen °Do not take more than  3,000 mg per day    ° ° ° ° °For additional information about how and where to safely dispose of unused opioid °medications - https://www.morepowerfulnc.org ° °Disclaimer: This document  contains information and/or instructional materials adapted from [facility] for the typical patient with your condition. It does not replace medical advice from your health care provider because your experience may differ from that of the °typical patient. Talk to your health care provider if you have any questions about this °document, your condition or your treatment plan. °Adapted from [facility] ° °

## 2020-12-27 ENCOUNTER — Ambulatory Visit (INDEPENDENT_AMBULATORY_CARE_PROVIDER_SITE_OTHER): Payer: Self-pay | Admitting: Family Medicine

## 2020-12-27 ENCOUNTER — Other Ambulatory Visit: Payer: Self-pay

## 2020-12-27 ENCOUNTER — Encounter: Payer: Self-pay | Admitting: Family Medicine

## 2020-12-27 VITALS — BP 91/57 | HR 61 | Temp 98.3°F | Resp 16 | Ht 64.88 in | Wt 144.2 lb

## 2020-12-27 DIAGNOSIS — N946 Dysmenorrhea, unspecified: Secondary | ICD-10-CM

## 2020-12-27 DIAGNOSIS — Z7689 Persons encountering health services in other specified circumstances: Secondary | ICD-10-CM

## 2020-12-27 DIAGNOSIS — Z9049 Acquired absence of other specified parts of digestive tract: Secondary | ICD-10-CM

## 2020-12-27 MED ORDER — IBUPROFEN 800 MG PO TABS: 800.0000 mg | ORAL_TABLET | Freq: Three times a day (TID) | ORAL | 1 refills | Status: DC | PRN

## 2020-12-27 NOTE — Progress Notes (Signed)
Pt presents for hospital follow-up from 07/26-07/28 for appendectomy pt request refill on Ibuprofen 800mg  due to menstrual cycle pain

## 2020-12-27 NOTE — Progress Notes (Signed)
New Patient Office Visit  Subjective:  Patient ID: Anita [name], female    DOB: 1990-09-23  Age: 30 y.o. MRN: [id number]  CC:  Chief Complaint  Patient presents with   Hospitalization Follow-up    HPI Anita [name] presents for to establish care. Patient reports that she had an appy about 3 weeks ago and is doing well. Today she complains of menstrual cramps and would like meds for the same.   Past Medical History:  Diagnosis Date   Depression    takes no meds    Past Surgical History:  Procedure Laterality Date   CERVICAL CERCLAGE  07/03/2011   Procedure: CERCLAGE CERVICAL;  Surgeon: Kathreen Cosier, MD;  Location: [facility];  Service: Gynecology;  Laterality: N/A;   CESAREAN SECTION  05/21/2008   62month fetal demise.Marland Kitchen    CESAREAN SECTION  12/04/2011   Procedure: CESAREAN SECTION;  Surgeon: Kathreen Cosier, MD;  Location: [facility];  Service: Gynecology;  Laterality: N/A;   CESAREAN SECTION WITH BILATERAL TUBAL LIGATION Bilateral 12/31/2016   Procedure: REPEAT CESAREAN SECTION;  Surgeon: Willodean Rosenthal, MD;  Location: [facility];  Service: Obstetrics;  Laterality: Bilateral;   LAPAROSCOPIC APPENDECTOMY N/A 12/13/2020   Procedure: APPENDECTOMY LAPAROSCOPIC;  Surgeon: Harriette Bouillon, MD;  Location: [facility] OR;  Service: General;  Laterality: N/A;   TUBAL LIGATION      History reviewed. No pertinent family history.  Social History   Socioeconomic History   Marital status: Single    Spouse name: Not on file   Number of children: Not on file   Years of education: Not on file   Highest education level: Not on file  Occupational History   Not on file  Tobacco Use   Smoking status: Former    Types: Cigarettes    Quit date: 07/02/2010    Years since quitting: 10.4   Smokeless tobacco: Never  Vaping Use   Vaping Use: Never used  Substance and Sexual Activity   Alcohol use: No   Drug use: No   Sexual activity: Not Currently    Birth  control/protection: None  Other Topics Concern   Not on file  Social History Narrative   Not on file   Social Determinants of Health   Financial Resource Strain: Not on file  Food Insecurity: Not on file  Transportation Needs: Not on file  Physical Activity: Not on file  Stress: Not on file  Social Connections: Not on file  Intimate Partner Violence: Not on file    ROS Review of Systems  Gastrointestinal:  Positive for constipation. Negative for abdominal pain and diarrhea.  All other systems reviewed and are negative.  Objective:   Today's Vitals: BP (!) 91/57 (BP Location: Left Arm, Patient Position: Sitting, Cuff Size: Normal)   Pulse 61   Temp 98.3 F (36.8 C)   Resp 16   Ht 5' 4.88" (1.648 m)   Wt 144 lb 3.2 oz (65.4 kg)   SpO2 98%   BMI 24.08 kg/m   Physical Exam Vitals and nursing note reviewed.  Constitutional:      General: She is not in acute distress. Cardiovascular:     Rate and Rhythm: Normal rate and regular rhythm.  Pulmonary:     Effort: Pulmonary effort is normal.     Breath sounds: Normal breath sounds.  Abdominal:     General: There is no distension.     Palpations: Abdomen is soft.     Tenderness: There is no abdominal  tenderness.  Neurological:     General: No focal deficit present.     Mental Status: She is alert and oriented to person, place, and time.    Assessment & Plan:   Problem List Items Addressed This Visit   None Visit Diagnoses     Dysmenorrhea    -  Primary   ibuprofen prescribed   Relevant Medications   ibuprofen (ADVIL) 800 MG tablet   S/P appy       doing well post surgery   Encounter to establish care           Outpatient Encounter Medications as of 12/27/2020  Medication Sig   acetaminophen (TYLENOL) 500 MG tablet Take 2 tablets (1,000 mg total) by mouth every 6 (six) hours as needed.   ibuprofen (ADVIL) 800 MG tablet Take 1 tablet (800 mg total) by mouth every 8 (eight) hours as needed.   oxyCODONE (OXY  IR/ROXICODONE) 5 MG immediate release tablet Take 1 tablet (5 mg total) by mouth every 4 (four) hours as needed for moderate pain.   [DISCONTINUED] ibuprofen (ADVIL) 800 MG tablet Take 800 mg by mouth every 8 (eight) hours as needed.   [DISCONTINUED] ibuprofen (ADVIL) 200 MG tablet Take 400 mg by mouth every 6 (six) hours as needed for fever, headache or mild pain.   No facility-administered encounter medications on file as of 12/27/2020.    Follow-up: Return if symptoms worsen or fail to improve.   Tommie Raymond, MD

## 2022-01-26 IMAGING — CT CT ABD-PELV W/ CM
2 of 5 series · 16 of 46 positions shown, 18 images · IV contrast (APPLIED)
Comparison: None.

CLINICAL DATA: RLQ abdominal pain, appendicitis suspected (Age >=
14y)

EXAM:
CT ABDOMEN AND PELVIS WITH CONTRAST
TECHNIQUE: Multidetector CT imaging of the abdomen and pelvis was performed
using the standard protocol following bolus administration of
intravenous contrast.
CONTRAST:  75mL OMNIPAQUE IOHEXOL 350 MG/ML SOLN

[Series 2: abd pel w · axial · 0.69mm/px · z∈[+929,+1329]mm · 13 of 90 slices shown, 15 images]
[im 5/90  soft-tissue]
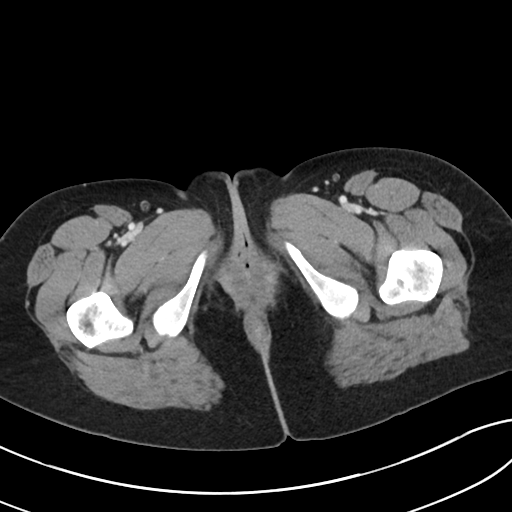
[im 5/90  bone]
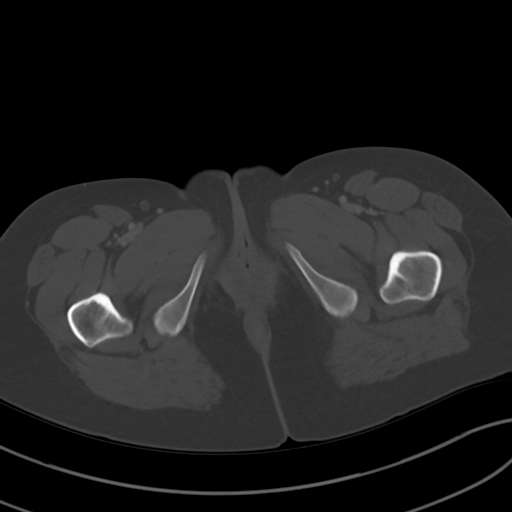
[im 14/90  soft-tissue]
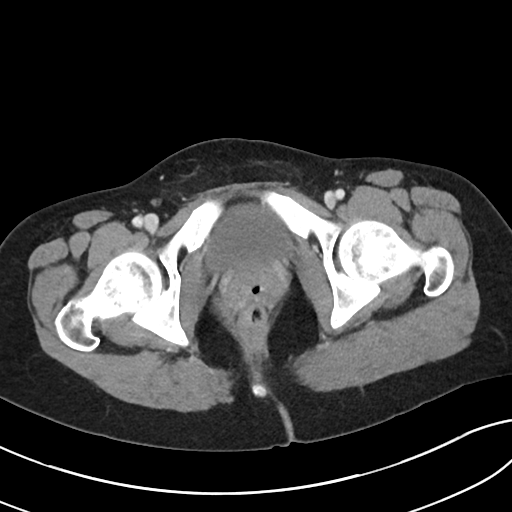
[im 18/90  soft-tissue]
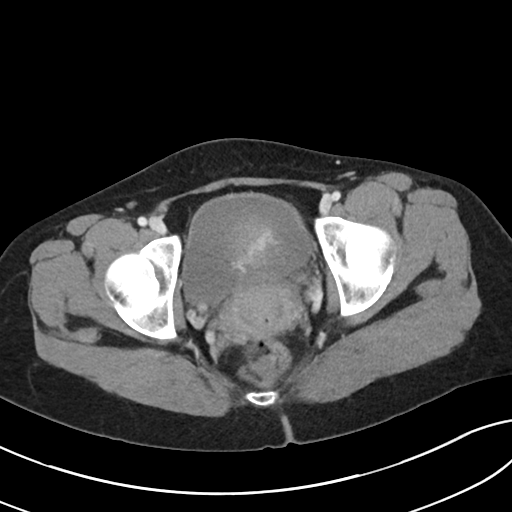
[im 27/90  soft-tissue]
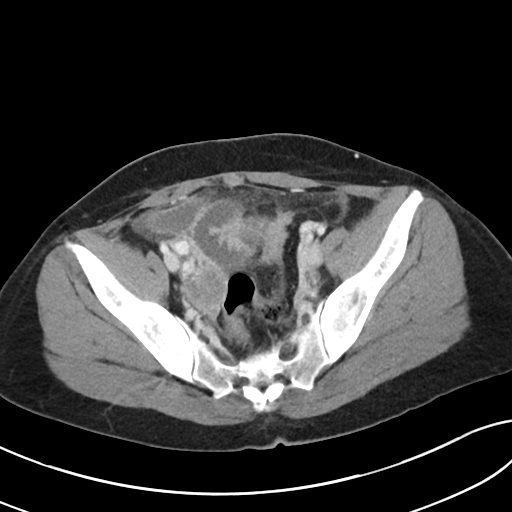
[im 32/90  soft-tissue]
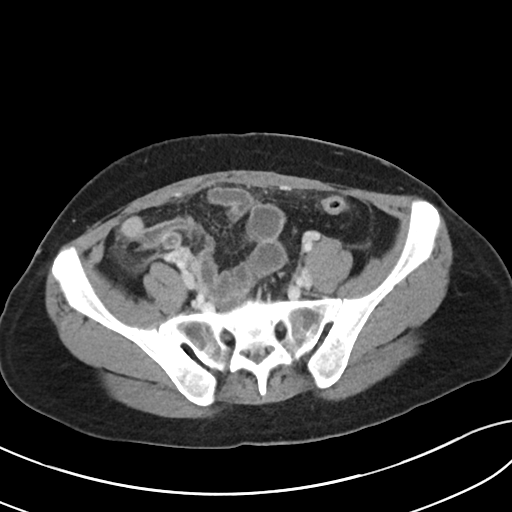
[im 41/90  soft-tissue]
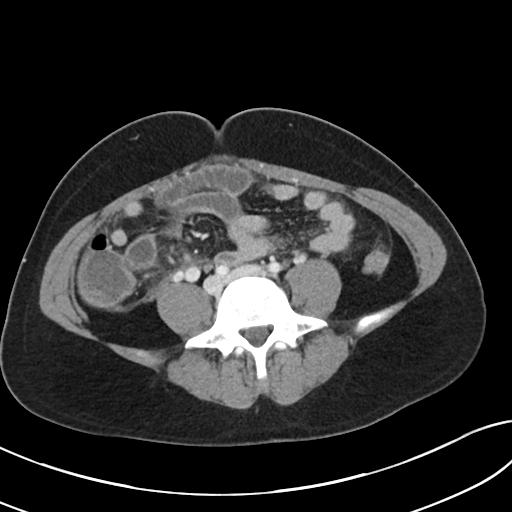
[im 45/90  soft-tissue]
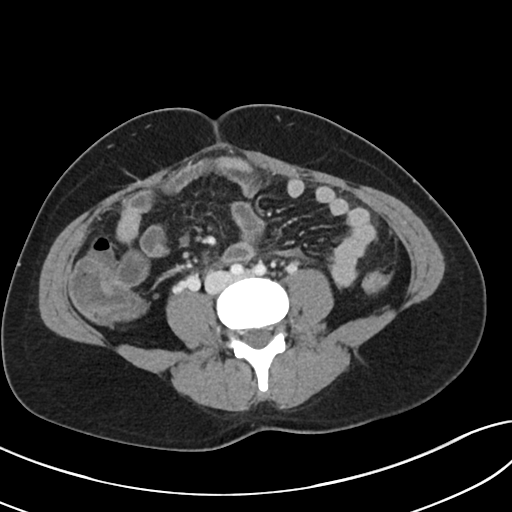
[im 49/90  soft-tissue]
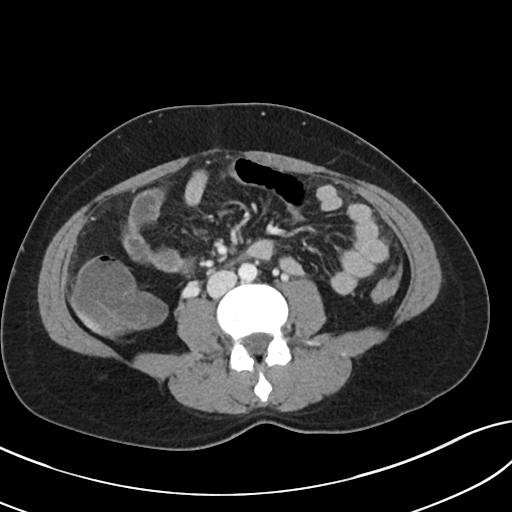
[im 58/90  soft-tissue]
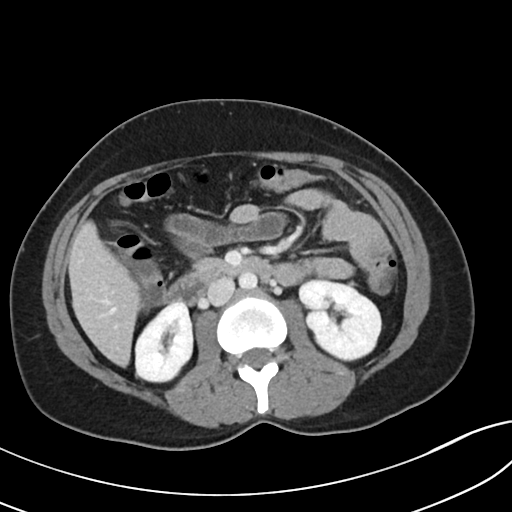
[im 58/90  bone]
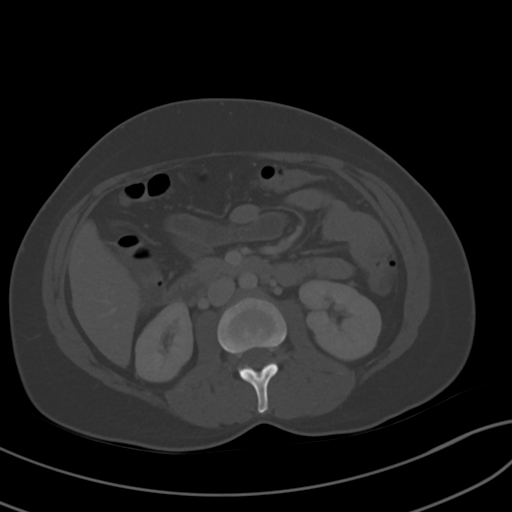
[im 63/90  soft-tissue]
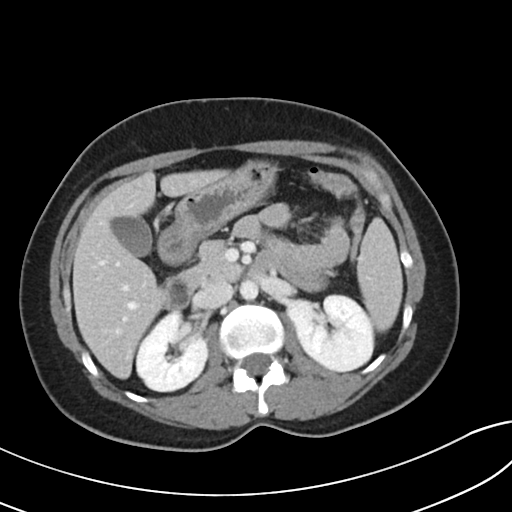
[im 72/90  soft-tissue]
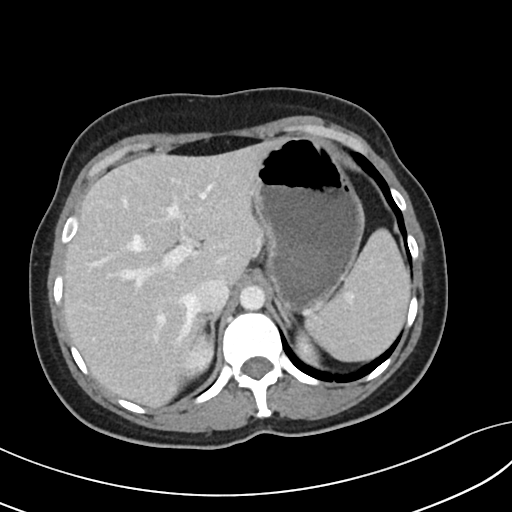
[im 76/90  soft-tissue]
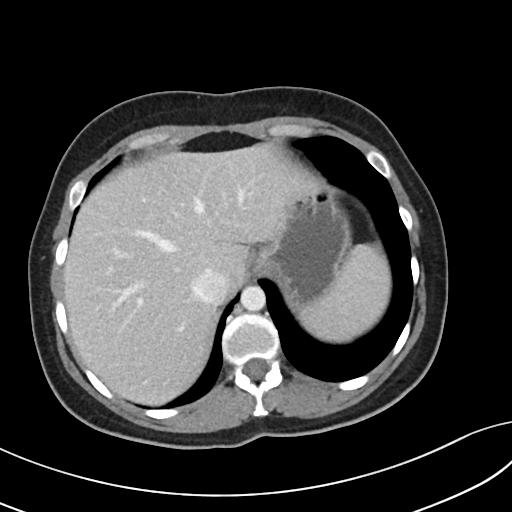
[im 85/90  soft-tissue]
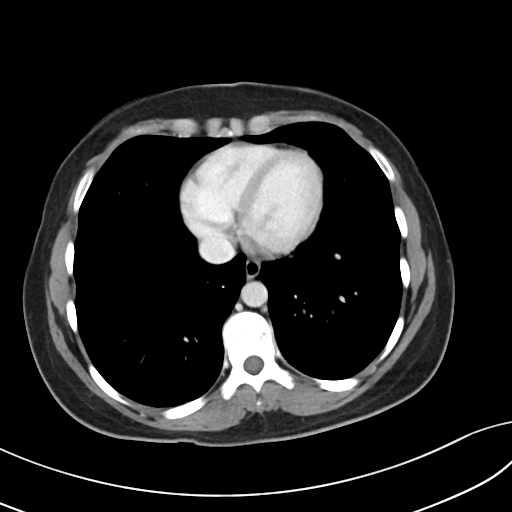

[Series 5: coronal · coronal · 0.79mm/px · 3 of 90 slices shown]
[im 30/90  soft-tissue]
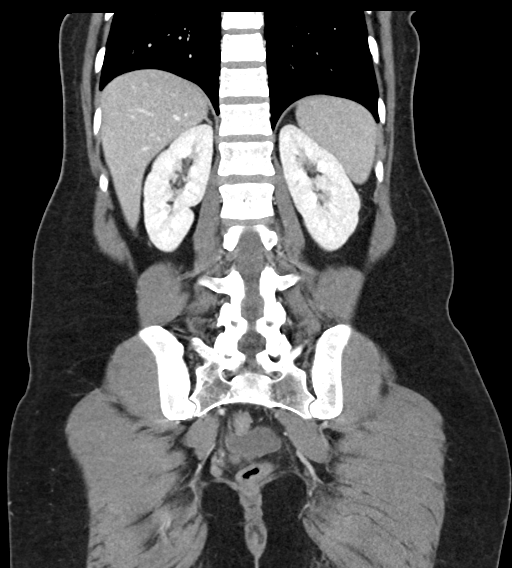
[im 40/90  soft-tissue]
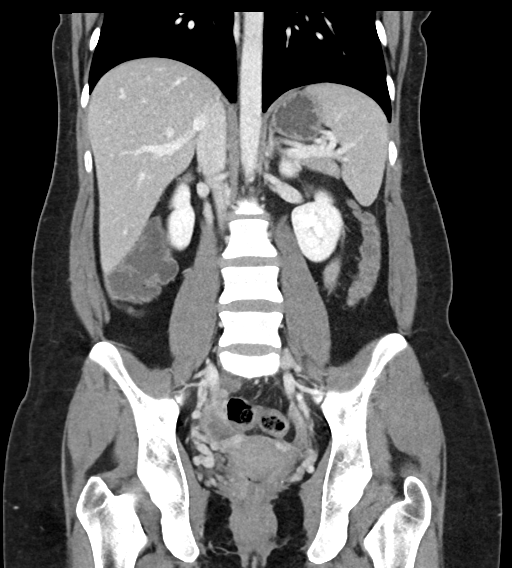
[im 50/90  soft-tissue]
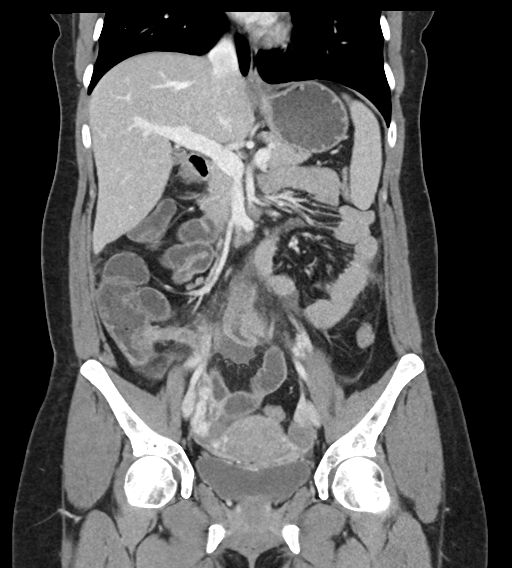

[16 of 46 positions shown; findings below may reference images not displayed]

FINDINGS: Lower chest: No acute abnormality.

Hepatobiliary: No focal liver abnormality is seen. No gallstones,
gallbladder wall thickening, or biliary dilatation.

Pancreas: Unremarkable. No pancreatic ductal dilatation or
surrounding inflammatory changes.

Spleen: Normal in size without focal abnormality.

Adrenals/Urinary Tract: Adrenal glands are unremarkable. No
hydronephrosis. Punctate nonobstructive right renal stone in the mid
kidney. Bladder is unremarkable.

Stomach/Bowel: The stomach is within normal limits. There is no
evidence of bowel obstruction. The appendix is dilated with
periappendiceal fat stranding and hyperenhancing appendiceal wall.

Vascular/Lymphatic: No significant vascular findings are present.

Reproductive: Corpus luteal cyst on the right ovary. Arcuate uterine
morphology.

Other: No abdominal hernia.  Small volume pelvic free fluid.

Musculoskeletal: No acute or significant osseous findings.
IMPRESSION: Acute appendicitis. Small volume pelvic free fluid. No rim enhancing
abscess.

Appendix: Location: Pelvis

Diameter: 1.0 cm

Appendicolith: None

Mucosal hyper-enhancement: Yes

Extraluminal gas: None

Periappendiceal collection: No

## 2023-02-08 ENCOUNTER — Ambulatory Visit (HOSPITAL_COMMUNITY)
Admission: EM | Admit: 2023-02-08 | Discharge: 2023-02-08 | Disposition: A | Discharge status: Home / Self Care | Payer: Self-pay | Attending: Emergency Medicine | Admitting: Emergency Medicine

## 2023-02-08 ENCOUNTER — Encounter (HOSPITAL_COMMUNITY): Payer: Self-pay

## 2023-02-08 DIAGNOSIS — N939 Abnormal uterine and vaginal bleeding, unspecified: Secondary | ICD-10-CM | POA: Insufficient documentation

## 2023-02-08 DIAGNOSIS — R519 Headache, unspecified: Secondary | ICD-10-CM | POA: Insufficient documentation

## 2023-02-08 DIAGNOSIS — D649 Anemia, unspecified: Secondary | ICD-10-CM | POA: Insufficient documentation

## 2023-02-08 LAB — CBC
HCT: 34.2 % — ABNORMAL LOW (ref 36.0–46.0)
Hemoglobin: 11.6 g/dL — ABNORMAL LOW (ref 12.0–15.0)
MCH: 29.6 pg (ref 26.0–34.0)
MCHC: 33.9 g/dL (ref 30.0–36.0)
MCV: 87.2 fL (ref 80.0–100.0)
Platelets: 280 10*3/uL (ref 150–400)
RBC: 3.92 MIL/uL (ref 3.87–5.11)
RDW: 13.1 % (ref 11.5–15.5)
WBC: 6.8 10*3/uL (ref 4.0–10.5)
nRBC: 0 % (ref 0.0–0.2)

## 2023-02-08 LAB — POCT URINE PREGNANCY: Preg Test, Ur: NEGATIVE

## 2023-02-08 MED ORDER — FERROUS SULFATE 325 (65 FE) MG PO TABS: 325.0000 mg | ORAL_TABLET | ORAL | 2 refills | Status: AC

## 2023-02-08 MED ORDER — IBUPROFEN 800 MG PO TABS
800.0000 mg | ORAL_TABLET | Freq: Once | ORAL | Status: AC
  Administered 2023-02-08: 800 mg via ORAL

## 2023-02-08 MED ORDER — MEGESTROL ACETATE 40 MG PO TABS: 40.0000 mg | ORAL_TABLET | Freq: Two times a day (BID) | ORAL | 0 refills | Status: AC

## 2023-02-08 MED ORDER — IBUPROFEN 800 MG PO TABS: 800.0000 mg | ORAL_TABLET | Freq: Three times a day (TID) | ORAL | 0 refills | Status: AC

## 2023-02-08 MED ORDER — IBUPROFEN 800 MG PO TABS
ORAL_TABLET | ORAL | Status: AC
  Filled 2023-02-08: qty 1

## 2023-02-08 NOTE — ED Triage Notes (Addendum)
Patient states for the last 20 days she has been having vaginal bleeding. States the period started off light with no pain, then she started producing clots with heavier bleeding. States she has a history of irregular and painful periods. States this had got better for a while after having her tubes tied.   Patient states that this morning she was painting a house when she developed a headache, light sensitivity, "flashing" in her vision, weakness, and felt like she was going to faint. No LOC but headache has not resolved since this morning.

## 2023-02-08 NOTE — ED Provider Notes (Signed)
[facility]    CSN: [id number] Arrival date & time: 02/08/23  1723     History   Chief Complaint Chief Complaint  Patient presents with   Menorrhagia   Headache    HPI Anita [name] is a 32 y.o. female.  Reporting 20 day history of vaginal bleeding. Started 8/28 She has been noticing clots with heavier bleeding recently.  History of irregular cycles. Had her tubes tied Possible history of anemia. On chart review, her hgb in 2022 was 9.3  Also feeling dizziness and headache since this morning. Was standing up painting and got dizzy. She did not lose consciousness. Not currently having any dizziness or vision changes, just mild headache. 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   Mastitis, postpartum 01/10/2017   Status post repeat low transverse cesarean section 12/31/2016   Supervision of high risk pregnancy, antepartum 08/30/2016   Previous cesarean section 08/30/2016   History of classical cesarean section 08/30/2016   History of incompetent cervix, currently pregnant 08/23/2016   Previous preterm delivery, antepartum 08/23/2016   Late prenatal care affecting pregnancy 08/23/2016    Past Surgical History:  Procedure Laterality Date   CERVICAL CERCLAGE  07/03/2011   Procedure: CERCLAGE CERVICAL;  Surgeon: Kathreen Cosier, MD;  Location: [facility];  Service: Gynecology;  Laterality: N/A;   CESAREAN SECTION  05/21/2008   70month fetal demise.Marland Kitchen    CESAREAN SECTION  12/04/2011   Procedure: CESAREAN SECTION;  Surgeon: Kathreen Cosier, MD;  Location: [facility];  Service: Gynecology;  Laterality: N/A;   CESAREAN SECTION WITH BILATERAL TUBAL LIGATION Bilateral 12/31/2016   Procedure: REPEAT CESAREAN SECTION;  Surgeon: Willodean Rosenthal, MD;   Location: [facility];  Service: Obstetrics;  Laterality: Bilateral;   LAPAROSCOPIC APPENDECTOMY N/A 12/13/2020   Procedure: APPENDECTOMY LAPAROSCOPIC;  Surgeon: Harriette Bouillon, MD;  Location: [facility];  Service: General;  Laterality: N/A;   TUBAL LIGATION      OB History     Gravida  4   Para  3   Term  1   Preterm  2   AB  1   Living  2      SAB  1   IAB      Ectopic      Multiple      Live Births  2        Obstetric Comments  1st pregnancy, water broke, hospitalized, abx, then bleeding and C/S 2nd pregnancy, loss at 4 weeks 3rd pregnancy, Cerclage due to previous losses then repeat C/S          Home Medications    Prior to Admission medications   Medication Sig Start Date End Date Taking? Authorizing Provider  ferrous sulfate 325 (65 FE) MG tablet Take 1 tablet (325 mg total) by mouth every other day. 02/08/23  Yes Taeshawn Helfman, Lurena Joiner, PA-C  ibuprofen (ADVIL) 800 MG tablet Take 1 tablet (800 mg total) by mouth 3 (three) times daily. 02/08/23  Yes Burtis Imhoff, Lurena Joiner, PA-C  megestrol (MEGACE) 40 MG tablet Take 1 tablet (40 mg total) by mouth 2 (two) times daily for 14 days. 02/08/23 02/22/23 Yes Zareya Tuckett, Ray Church    Family History History reviewed. No pertinent family history.  Social History Social History   Tobacco Use  Smoking status: Former    Current packs/day: 0.00    Types: Cigarettes    Quit date: 07/02/2010    Years since quitting: 12.6   Smokeless tobacco: Never  Vaping Use   Vaping status: Never Used  Substance Use Topics   Alcohol use: No   Drug use: No     Allergies   Patient has no known allergies.   Review of Systems Review of Systems Per HPI  Physical Exam Triage Vital Signs ED Triage Vitals  Encounter Vitals Group     BP 02/08/23 1824 113/79     Systolic BP Percentile --      Diastolic BP Percentile --      Pulse Rate 02/08/23 1824 63     Resp 02/08/23 1824 16     Temp 02/08/23 1824 98.1 F (36.7 C)     Temp  Source 02/08/23 1824 Oral     SpO2 02/08/23 1824 97 %     Weight 02/08/23 1824 150 lb (68 kg)     Height 02/08/23 1824 5\' 4"  (1.626 m)     Head Circumference --      Peak Flow --      Pain Score 02/08/23 1822 8     Pain Loc --      Pain Education --      Exclude from Growth Chart --    No data found.  Updated Vital Signs BP 113/79 (BP Location: Left Arm)   Pulse 63   Temp 98.1 F (36.7 C) (Oral)   Resp 16   Ht 5\' 4"  (1.626 m)   Wt 150 lb (68 kg)   LMP 01/18/2023 (Approximate)   SpO2 97%   Breastfeeding No   BMI 25.75 kg/m   Physical Exam Vitals and nursing note reviewed.  Constitutional:      Appearance: Normal appearance.  HENT:     Head: Atraumatic.     Mouth/Throat:     Mouth: Mucous membranes are moist.     Pharynx: Oropharynx is clear.  Eyes:     Pupils: Pupils are equal, round, and reactive to light.     Comments: Mildly pale palpebral conjunctiva bilaterally   Cardiovascular:     Rate and Rhythm: Normal rate and regular rhythm.     Heart sounds: Normal heart sounds.  Pulmonary:     Effort: Pulmonary effort is normal. No respiratory distress.     Breath sounds: Normal breath sounds.  Abdominal:     General: Bowel sounds are normal.     Palpations: Abdomen is soft.     Tenderness: There is no abdominal tenderness. There is no right CVA tenderness, left CVA tenderness, guarding or rebound.  Musculoskeletal:        General: Normal range of motion.     Cervical back: Normal range of motion.  Skin:    General: Skin is warm and dry.  Neurological:     Mental Status: She is alert and oriented to person, place, and time.     Cranial Nerves: Cranial nerves 2-12 are intact. No cranial nerve deficit.     Sensory: Sensation is intact.     Motor: Motor function is intact. No weakness.     Coordination: Coordination is intact.     Gait: Gait is intact.     Comments: Strength and sensation intact throughout      UC Treatments / Results  Labs (all labs ordered  are listed, but only abnormal results are displayed) Labs Reviewed  CBC - Abnormal; Notable for the following components:      Result Value   Hemoglobin 11.6 (*)    HCT 34.2 (*)    All other components within normal limits  POCT URINE PREGNANCY    EKG  Radiology No results found.  Procedures Procedures   Medications Ordered in UC Medications  ibuprofen (ADVIL) tablet 800 mg (800 mg Oral Given 02/08/23 1903)    Initial Impression / Assessment and Plan / UC Course  I have reviewed the triage vital signs and the nursing notes.  Pertinent labs & imaging results that were available during my care of the patient were reviewed by me and considered in my medical decision making (see chart for details).  Stable vitals. Reassuring she is not tachycardic. Overall good exam, neurologically intact  UPT negative CBC hbg is 11.6. Can start iron supplement every other day. Megace BID until follow up. I scheduled her with a PCP for October 2nd (in 2 weeks). Advised to call ob/gyn clinics to make appointment as well  Per patient request ibuprofen dose given for headache.  Sent as prescription.  Can continue at home.  Increase fluids. Discussed strict return and ED precautions.  Patient is agreeable to plan  Final Clinical Impressions(s) / UC Diagnoses   Final diagnoses:  Abnormal uterine bleeding (AUB)  Acute nonintractable headache, unspecified headache type  Mild anemia     Discharge Instructions      Your hemoglobin is 11 which is the low side of normal. I recommend to start taking an iron supplement every other day.  Please take the megase twice daily until you can follow up with a primary care provider or ob/gyn. Please note your bleeding will return once the medication is stopped. Call the ob/gyn clinics to make an appointment.  I have scheduled you with a primary care provider for October 2nd.  Continue ibuprofen for headache if needed. Drink lots of water!  Please go to  the emergency department if symptoms worsen.     ED Prescriptions     Medication Sig Dispense Auth. Provider   ibuprofen (ADVIL) 800 MG tablet Take 1 tablet (800 mg total) by mouth 3 (three) times daily. 21 tablet Aldric Wenzler, PA-C   megestrol (MEGACE) 40 MG tablet Take 1 tablet (40 mg total) by mouth 2 (two) times daily for 14 days. 28 tablet Marilouise Densmore, PA-C   ferrous sulfate 325 (65 FE) MG tablet Take 1 tablet (325 mg total) by mouth every other day. 30 tablet Bria Sparr, Lurena Joiner, PA-C      PDMP not reviewed this encounter.   Javontae Marlette, Ray Church 02/08/23 2101

## 2023-02-08 NOTE — Discharge Instructions (Addendum)
Your hemoglobin is 11 which is the low side of normal. I recommend to start taking an iron supplement every other day.  Please take the megase twice daily until you can follow up with a primary care provider or ob/gyn. Please note your bleeding will return once the medication is stopped. Call the ob/gyn clinics to make an appointment.  I have scheduled you with a primary care provider for October 2nd.  Continue ibuprofen for headache if needed. Drink lots of water!  Please go to the emergency department if symptoms worsen.

## 2023-02-20 ENCOUNTER — Ambulatory Visit: Payer: Self-pay | Admitting: Family

## 2024-05-27 ENCOUNTER — Emergency Department (HOSPITAL_COMMUNITY): Payer: Self-pay

## 2024-05-27 ENCOUNTER — Encounter (HOSPITAL_COMMUNITY): Payer: Self-pay

## 2024-05-27 ENCOUNTER — Emergency Department (HOSPITAL_COMMUNITY)
Admission: EM | Admit: 2024-05-27 | Discharge: 2024-05-27 | Disposition: A | Discharge status: Home / Self Care | Payer: Self-pay | Attending: Emergency Medicine | Admitting: Emergency Medicine

## 2024-05-27 ENCOUNTER — Other Ambulatory Visit: Payer: Self-pay

## 2024-05-27 DIAGNOSIS — N939 Abnormal uterine and vaginal bleeding, unspecified: Secondary | ICD-10-CM | POA: Insufficient documentation

## 2024-05-27 LAB — CBC WITH DIFFERENTIAL/PLATELET
Abs Immature Granulocytes: 0.01 K/uL (ref 0.00–0.07)
Basophils Absolute: 0 K/uL (ref 0.0–0.1)
Basophils Relative: 1 %
Eosinophils Absolute: 0.1 K/uL (ref 0.0–0.5)
Eosinophils Relative: 2 %
HCT: 28.9 % — ABNORMAL LOW (ref 36.0–46.0)
Hemoglobin: 9.4 g/dL — ABNORMAL LOW (ref 12.0–15.0)
Immature Granulocytes: 0 %
Lymphocytes Relative: 37 %
Lymphs Abs: 1.9 K/uL (ref 0.7–4.0)
MCH: 26.8 pg (ref 26.0–34.0)
MCHC: 32.5 g/dL (ref 30.0–36.0)
MCV: 82.3 fL (ref 80.0–100.0)
Monocytes Absolute: 0.3 K/uL (ref 0.1–1.0)
Monocytes Relative: 6 %
Neutro Abs: 2.7 K/uL (ref 1.7–7.7)
Neutrophils Relative %: 54 %
Platelets: 319 K/uL (ref 150–400)
RBC: 3.51 MIL/uL — ABNORMAL LOW (ref 3.87–5.11)
RDW: 14.8 % (ref 11.5–15.5)
WBC: 5 K/uL (ref 4.0–10.5)
nRBC: 0 % (ref 0.0–0.2)

## 2024-05-27 LAB — URINALYSIS, ROUTINE W REFLEX MICROSCOPIC
Bacteria, UA: NONE SEEN
Bilirubin Urine: NEGATIVE
Glucose, UA: NEGATIVE mg/dL
Ketones, ur: NEGATIVE mg/dL
Leukocytes,Ua: NEGATIVE
Nitrite: NEGATIVE
Protein, ur: 30 mg/dL — AB
RBC / HPF: >50 RBC/hpf (ref 0–5)
Specific Gravity, Urine: 1.02 (ref 1.005–1.030)
pH: 6 (ref 5.0–8.0)

## 2024-05-27 LAB — HCG, SERUM, QUALITATIVE: Preg, Serum: NEGATIVE

## 2024-05-27 MED ORDER — IBUPROFEN 800 MG PO TABS
800.0000 mg | ORAL_TABLET | Freq: Once | ORAL | Status: AC
  Administered 2024-05-27: 800 mg via ORAL
  Filled 2024-05-27: qty 1

## 2024-05-27 MED ORDER — NICOTINE 14 MG/24HR TD PT24: 14.0000 mg | MEDICATED_PATCH | Freq: Every day | TRANSDERMAL | 0 refills | Status: AC

## 2024-05-27 MED ORDER — MEGESTROL ACETATE 40 MG PO TABS: 40.0000 mg | ORAL_TABLET | Freq: Two times a day (BID) | ORAL | 0 refills | Status: AC

## 2024-05-27 MED ORDER — MEGESTROL ACETATE 40 MG PO TABS
40.0000 mg | ORAL_TABLET | Freq: Every day | ORAL | Status: DC
  Administered 2024-05-27: 40 mg via ORAL
  Filled 2024-05-27: qty 1

## 2024-05-27 NOTE — ED Notes (Signed)
 Patient transported to Ultrasound

## 2024-05-27 NOTE — ED Notes (Signed)
 Pt asking about financial aid. Secretary made aware and to call registration to answer questions about financial help with today's bill.

## 2024-05-27 NOTE — ED Triage Notes (Signed)
 Pt came invia POV d/t her irregular menstrual cycle causing her to have vaginal bleeding since November 2nd, 2025. States it is very dark, big clots & very painful. A/Ox4, rates her abd cramping that radiates around to her lower back a 8/10 during triage.

## 2024-05-27 NOTE — ED Provider Notes (Signed)
 " [place] EMERGENCY DEPARTMENT AT [facility] Provider Note   CSN: [id number] Arrival date & time: 05/27/24  9166     Patient presents with: Vaginal Bleeding   Anita [name] is a 34 y.o. female.   34 year old female history of dysmenorrhea and iron deficiency anemia who presents to the emergency department with vaginal bleeding for a month.  Says that since her LMP has had daily bleeding.  Passing large clots as well.  Goes through 10 pads per day.  Has started feeling generally weak.  Not on blood thinners.  Has not had an ultrasound of her pelvis no known history of fibroids or malignancy.  Not currently on OCPs.  Had her tubes tied.  Does smoke cigarettes.  Reports pain when she is passing clots.       Prior to Admission medications  Medication Sig Start Date End Date Taking? Authorizing Provider  megestrol  (MEGACE ) 40 MG tablet Take 1 tablet (40 mg total) by mouth 2 (two) times daily for 7 days. 05/27/24 06/03/24 Yes Yolande Lamar BROCKS, MD  nicotine  (NICODERM CQ  - DOSED IN MG/24 HOURS) 14 mg/24hr patch Place 1 patch (14 mg total) onto the skin daily. 05/27/24  Yes Yolande Lamar BROCKS, MD  ferrous sulfate  325 (65 FE) MG tablet Take 1 tablet (325 mg total) by mouth every other day. 02/08/23   Rising, [place]berry, PA-C  ibuprofen  (ADVIL ) 800 MG tablet Take 1 tablet (800 mg total) by mouth 3 (three) times daily. 02/08/23   Rising, Asberry, PA-C    Allergies: Patient has no known allergies.    Review of Systems  Updated Vital Signs BP 116/71   Pulse 64   Temp 97.8 F (36.6 C) (Oral)   Resp 18   Ht 5' 3 (1.6 m)   Wt 77.1 kg   LMP 04/30/2024   SpO2 98%   BMI 30.11 kg/m   Physical Exam Abdominal:     General: There is no distension.     Palpations: There is no mass.     Tenderness: There is no abdominal tenderness. There is no guarding.  Genitourinary:    Comments: Chaperoned by catherine RN.  External genitalia unremarkable. Nor rashes or lesions  noted.  Speculum exam with clots and blood from cervix  Vaginal wall mucosa is unremarkable.     (all labs ordered are listed, but only abnormal results are displayed) Labs Reviewed  CBC WITH DIFFERENTIAL/PLATELET - Abnormal; Notable for the following components:      Result Value   RBC 3.51 (*)    Hemoglobin 9.4 (*)    HCT 28.9 (*)    All other components within normal limits  URINALYSIS, ROUTINE W REFLEX MICROSCOPIC - Abnormal; Notable for the following components:   APPearance CLOUDY (*)    Hgb urine dipstick LARGE (*)    Protein, ur 30 (*)    All other components within normal limits  HCG, SERUM, QUALITATIVE    EKG: None  Radiology: US  PELVIC COMPLETE WITH TRANSVAGINAL Result Date: 05/27/2024 CLINICAL DATA:  Vaginal bleeding EXAM: TRANSABDOMINAL AND TRANSVAGINAL ULTRASOUND OF PELVIS TECHNIQUE: Both transabdominal and transvaginal ultrasound examinations of the pelvis were performed. Transabdominal technique was performed for global imaging of the pelvis including uterus, ovaries, adnexal regions, and pelvic cul-de-sac. It was necessary to proceed with endovaginal exam following the transabdominal exam to visualize the endometrium. COMPARISON:  December 12, 2020 FINDINGS: Uterus Measurements: 9.1 x 6.5 x 4.6 cm = volume: 143 mL. No fibroids or other mass visualized. Endometrium  Thickness: 21 mm which is abnormally thickened. Focal soft tissue abnormality measuring 1.5 x 1.2 x 0.7 cm is noted in the cervix. Right ovary Measurements: 3.7 x 3.2 x 2.6 cm = volume: 16 mL. Normal appearance/no adnexal mass. Left ovary Measurements: 3.1 x 2.7 x 2.2 cm = volume: 10 mL. Normal appearance/no adnexal mass. Other findings No abnormal free fluid. IMPRESSION: Abnormally thickened endometrium is noted. Focal soft tissue abnormality measuring 1.5 x 1.2 x 0.7 cm is noted in the cervix. Consider further evaluation with sonohysterogram for confirmation prior to hysteroscopy. Endometrial sampling should also  be considered if patient is at high risk for endometrial carcinoma. (Ref: Radiological Reasoning: Algorithmic Workup of Abnormal Vaginal Bleeding with Endovaginal Sonography and Sonohysterography. AJR 2008; [id number]:[id number]) Electronically Signed   By: Lynwood Landy Raddle M.D.   On: 05/27/2024 15:07     Procedures   Medications Ordered in the ED  megestrol  (MEGACE ) tablet 40 mg (40 mg Oral Given 05/27/24 1455)  ibuprofen  (ADVIL ) tablet 800 mg (800 mg Oral Given 05/27/24 1329)                                    Medical Decision Making Amount and/or Complexity of Data Reviewed Labs: ordered. Radiology: ordered.  Risk OTC drugs. Prescription drug management.   34 year old female history of dysmenorrhea as well as iron deficiency anemia who presents with vaginal bleeding for the past month  Initial Ddx:  Vaginal bleeding, anemia, malignancy, fibroids, endometrial hyperplasia  MDM/Course:  Patient presents to the emergency department with vaginal bleeding for the past month.  Does have a history of this in the past but reports that this is much longer than it usually persists.  Not on any blood thinners.  Is feeling generally weak but no other symptoms of anemia.  On exam does have some bleeding from the cervical os.  No obvious masses were appreciated on her pelvic exam.  Had an ultrasound that does show thickened endometrium with a soft tissue abnormality in the cervix.  Did explain these findings to the patient and the fact that she will need a biopsy to ensure that there is no malignancy.  Will start her on OCPs at this point in time.  Does smoke cigarettes and says that she is going to stop so I given a nicotine  patch to her.  Did counsel her to look out for any symptoms of a DVT or PE.  Will have her follow-up with OB/GYN soon as possible regarding her bleeding and abnormal ultrasound findings   This patient presents to the ED for concern of complaints listed in HPI, this involves an extensive  number of treatment options, and is a complaint that carries with it a high risk of complications and morbidity. Disposition including potential need for admission considered.   Dispo: DC Home. Return precautions discussed including, but not limited to, those listed in the AVS. Allowed pt time to ask questions which were answered fully prior to dc.  I have reviewed the patients home medications and made adjustments as needed Records reviewed Outpatient Clinic Notes The following labs were independently interpreted: CBC and show chronic anemia  Portions of this note were generated with Dragon dictation software. Dictation errors may occur despite best attempts at proofreading.     Final diagnoses:  Vaginal bleeding    ED Discharge Orders          Ordered  megestrol  (MEGACE ) 40 MG tablet  2 times daily        05/27/24 1537    nicotine  (NICODERM CQ  - DOSED IN MG/24 HOURS) 14 mg/24hr patch  Daily        05/27/24 1539               Yolande Lamar BROCKS, MD 05/27/24 1947  "

## 2024-05-27 NOTE — Discharge Instructions (Addendum)
 You were seen for your vaginal bleeding in the emergency department.   At home, please take Tylenol  and ibuprofen  for any cramping that you have.  Take the Megace  we have prescribed you until you follow-up with your OB/GYN. Take it twice a day until the bleeding stops.   Do not smoke in that time. You may use the nicotine  patches we have given you to help you quit.   Check your MyChart online for the results of any tests that had not resulted by the time you left the emergency department.   Follow-up with your OB/GYN in 2-3 days regarding your visit.    Return immediately to the emergency department if you experience any of the following: Soaking through more than 2 pads per hour for 2 hours (4 pads total and 2 hours), or any other concerning symptoms.    Thank you for visiting our Emergency Department. It was a pleasure taking care of you today.

## 2024-05-27 NOTE — ED Triage Notes (Signed)
 Pt. Stated, vaginal bleeding for a month.

## 2024-05-27 NOTE — ED Notes (Signed)
 US  came to get patient. Provider to do pelvic exam. Informed US  we would call when patient is ready for the US  exam

## 2024-05-27 NOTE — ED Notes (Signed)
 Pt states she no longer wants to wait for registration. Pt states she will call once she received her bill. Pt states I know what to do.

## 2024-05-27 NOTE — ED Notes (Signed)
 US  called made aware patient ready for US
